# Patient Record
Sex: Female | Born: 1985 | Race: Black or African American | Hispanic: No | Marital: Single | Smoking: Never smoker
Health system: Southern US, Community
[De-identification: ages and names within clinical notes are randomized; demographics above are authoritative.]

## PROBLEM LIST (undated history)

## (undated) ENCOUNTER — Inpatient Hospital Stay (HOSPITAL_COMMUNITY): Payer: Self-pay

## (undated) DIAGNOSIS — F419 Anxiety disorder, unspecified: Secondary | ICD-10-CM

## (undated) DIAGNOSIS — E669 Obesity, unspecified: Secondary | ICD-10-CM

## (undated) DIAGNOSIS — D649 Anemia, unspecified: Secondary | ICD-10-CM

## (undated) DIAGNOSIS — R87629 Unspecified abnormal cytological findings in specimens from vagina: Secondary | ICD-10-CM

---
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
## 2004-01-23 ENCOUNTER — Ambulatory Visit: Payer: Self-pay | Admitting: Family Medicine

## 2004-02-20 ENCOUNTER — Emergency Department (HOSPITAL_COMMUNITY): Admission: EM | Admit: 2004-02-20 | Discharge: 2004-02-20 | Payer: Self-pay | Admitting: Emergency Medicine

## 2005-01-03 ENCOUNTER — Inpatient Hospital Stay (HOSPITAL_COMMUNITY): Admission: AD | Admit: 2005-01-03 | Discharge: 2005-01-03 | Payer: Self-pay | Admitting: Family Medicine

## 2005-10-13 ENCOUNTER — Inpatient Hospital Stay (HOSPITAL_COMMUNITY): Admission: AD | Admit: 2005-10-13 | Discharge: 2005-10-14 | Payer: Self-pay | Admitting: Obstetrics & Gynecology

## 2006-02-10 ENCOUNTER — Emergency Department (HOSPITAL_COMMUNITY): Admission: EM | Admit: 2006-02-10 | Discharge: 2006-02-10 | Payer: Self-pay | Admitting: Emergency Medicine

## 2006-02-23 ENCOUNTER — Inpatient Hospital Stay (HOSPITAL_COMMUNITY): Admission: AD | Admit: 2006-02-23 | Discharge: 2006-02-23 | Payer: Self-pay | Admitting: Gynecology

## 2006-07-05 ENCOUNTER — Emergency Department (HOSPITAL_COMMUNITY): Admission: EM | Admit: 2006-07-05 | Discharge: 2006-07-05 | Payer: Self-pay | Admitting: Emergency Medicine

## 2006-07-25 ENCOUNTER — Inpatient Hospital Stay (HOSPITAL_COMMUNITY): Admission: AD | Admit: 2006-07-25 | Discharge: 2006-07-25 | Payer: Self-pay | Admitting: Obstetrics & Gynecology

## 2006-10-13 ENCOUNTER — Emergency Department (HOSPITAL_COMMUNITY): Admission: EM | Admit: 2006-10-13 | Discharge: 2006-10-13 | Payer: Self-pay | Admitting: Emergency Medicine

## 2006-11-03 ENCOUNTER — Inpatient Hospital Stay (HOSPITAL_COMMUNITY): Admission: AD | Admit: 2006-11-03 | Discharge: 2006-11-04 | Payer: Self-pay | Admitting: Family Medicine

## 2007-01-04 ENCOUNTER — Emergency Department (HOSPITAL_COMMUNITY): Admission: EM | Admit: 2007-01-04 | Discharge: 2007-01-04 | Payer: Self-pay | Admitting: Family Medicine

## 2007-01-30 ENCOUNTER — Inpatient Hospital Stay (HOSPITAL_COMMUNITY): Admission: AD | Admit: 2007-01-30 | Discharge: 2007-01-30 | Payer: Self-pay | Admitting: Obstetrics and Gynecology

## 2007-03-17 ENCOUNTER — Inpatient Hospital Stay (HOSPITAL_COMMUNITY): Admission: AD | Admit: 2007-03-17 | Discharge: 2007-03-17 | Payer: Self-pay | Admitting: Obstetrics & Gynecology

## 2007-05-24 ENCOUNTER — Inpatient Hospital Stay (HOSPITAL_COMMUNITY): Admission: AD | Admit: 2007-05-24 | Discharge: 2007-05-24 | Payer: Self-pay | Admitting: Obstetrics and Gynecology

## 2007-06-02 ENCOUNTER — Inpatient Hospital Stay (HOSPITAL_COMMUNITY): Admission: AD | Admit: 2007-06-02 | Discharge: 2007-06-02 | Payer: Self-pay | Admitting: Obstetrics

## 2007-06-15 ENCOUNTER — Inpatient Hospital Stay (HOSPITAL_COMMUNITY): Admission: AD | Admit: 2007-06-15 | Discharge: 2007-06-15 | Payer: Self-pay | Admitting: Obstetrics and Gynecology

## 2007-06-25 ENCOUNTER — Inpatient Hospital Stay (HOSPITAL_COMMUNITY): Admission: AD | Admit: 2007-06-25 | Discharge: 2007-06-25 | Payer: Self-pay | Admitting: Obstetrics and Gynecology

## 2007-07-02 ENCOUNTER — Inpatient Hospital Stay (HOSPITAL_COMMUNITY): Admission: AD | Admit: 2007-07-02 | Discharge: 2007-07-05 | Payer: Self-pay | Admitting: Obstetrics and Gynecology

## 2007-10-21 ENCOUNTER — Emergency Department (HOSPITAL_COMMUNITY): Admission: EM | Admit: 2007-10-21 | Discharge: 2007-10-21 | Payer: Self-pay | Admitting: Emergency Medicine

## 2008-08-19 ENCOUNTER — Inpatient Hospital Stay (HOSPITAL_COMMUNITY): Admission: AD | Admit: 2008-08-19 | Discharge: 2008-08-19 | Payer: Self-pay | Admitting: Obstetrics and Gynecology

## 2008-10-02 ENCOUNTER — Emergency Department (HOSPITAL_COMMUNITY): Admission: EM | Admit: 2008-10-02 | Discharge: 2008-10-02 | Payer: Self-pay | Admitting: Family Medicine

## 2009-03-19 ENCOUNTER — Emergency Department (HOSPITAL_COMMUNITY): Admission: EM | Admit: 2009-03-19 | Discharge: 2009-03-19 | Payer: Self-pay | Admitting: Emergency Medicine

## 2009-04-10 ENCOUNTER — Emergency Department (HOSPITAL_COMMUNITY): Admission: EM | Admit: 2009-04-10 | Discharge: 2009-04-10 | Payer: Self-pay | Admitting: Emergency Medicine

## 2009-05-09 ENCOUNTER — Emergency Department (HOSPITAL_COMMUNITY): Admission: EM | Admit: 2009-05-09 | Discharge: 2009-05-09 | Payer: Self-pay | Admitting: Emergency Medicine

## 2009-11-02 ENCOUNTER — Ambulatory Visit: Payer: Self-pay | Admitting: Family

## 2009-11-02 ENCOUNTER — Inpatient Hospital Stay (HOSPITAL_COMMUNITY): Admission: AD | Admit: 2009-11-02 | Discharge: 2009-11-02 | Payer: Self-pay | Admitting: Obstetrics & Gynecology

## 2010-01-11 ENCOUNTER — Inpatient Hospital Stay (HOSPITAL_COMMUNITY): Admission: AD | Admit: 2010-01-11 | Discharge: 2010-01-11 | Payer: Self-pay | Admitting: Obstetrics and Gynecology

## 2010-01-11 ENCOUNTER — Ambulatory Visit: Payer: Self-pay | Admitting: Physician Assistant

## 2010-02-13 ENCOUNTER — Ambulatory Visit (HOSPITAL_COMMUNITY): Admission: RE | Admit: 2010-02-13 | Discharge: 2010-02-13 | Payer: Self-pay | Admitting: Obstetrics and Gynecology

## 2010-05-28 ENCOUNTER — Inpatient Hospital Stay (HOSPITAL_COMMUNITY)
Admission: AD | Admit: 2010-05-28 | Discharge: 2010-05-31 | DRG: 775 | Disposition: A | Discharge status: Home / Self Care | Payer: Medicaid Other | Source: Ambulatory Visit | Attending: Obstetrics and Gynecology | Admitting: Obstetrics and Gynecology

## 2010-05-28 DIAGNOSIS — E669 Obesity, unspecified: Secondary | ICD-10-CM | POA: Diagnosis present

## 2010-05-28 LAB — CBC
HCT: 31.2 % — ABNORMAL LOW (ref 36.0–46.0)
Hemoglobin: 10.2 g/dL — ABNORMAL LOW (ref 12.0–15.0)
MCH: 23 pg — ABNORMAL LOW (ref 26.0–34.0)
MCHC: 32.7 g/dL (ref 30.0–36.0)
MCV: 70.3 fL — ABNORMAL LOW (ref 78.0–100.0)
Platelets: 228 10*3/uL (ref 150–400)
RBC: 4.44 MIL/uL (ref 3.87–5.11)
RDW: 16 % — ABNORMAL HIGH (ref 11.5–15.5)
WBC: 13 10*3/uL — ABNORMAL HIGH (ref 4.0–10.5)

## 2010-05-29 LAB — RPR: RPR Ser Ql: NONREACTIVE

## 2010-05-30 LAB — CBC
HCT: 25.5 % — ABNORMAL LOW (ref 36.0–46.0)
Hemoglobin: 8 g/dL — ABNORMAL LOW (ref 12.0–15.0)
MCH: 22.3 pg — ABNORMAL LOW (ref 26.0–34.0)
MCHC: 31.4 g/dL (ref 30.0–36.0)
MCV: 71.2 fL — ABNORMAL LOW (ref 78.0–100.0)
Platelets: 199 10*3/uL (ref 150–400)
RBC: 3.58 MIL/uL — ABNORMAL LOW (ref 3.87–5.11)
RDW: 16.4 % — ABNORMAL HIGH (ref 11.5–15.5)
WBC: 10.4 10*3/uL (ref 4.0–10.5)

## 2010-05-30 NOTE — Discharge Summary (Signed)
  NAMEVAL, FARNAM NO.:  [id number]  MEDICAL RECORD NO.:  [id number]           PATIENT TYPE:  I  LOCATION:  9133                          FACILITY:  [facility]  PHYSICIAN:  Gerrit Friends. Aldona Bar, M.D.   DATE OF BIRTH:  11/29/1985  DATE OF ADMISSION:  05/28/2010 DATE OF DISCHARGE:  05/31/2010                              DISCHARGE SUMMARY   DISCHARGE DIAGNOSES: 1. 36 week pregnancy, delivered 6 pounds 7 ounces female infant, Apgars 8     and 9. 2. Blood type is O+. 3. Obesity.  PROCEDURE:  Normal spontaneous delivery.  SUMMARY:  This 25 year old gravida 64, now para 3 with a due date of June 26, 2010, presented at 36+ weeks gestation in labor.  At the time of admission, she was 4 cm dilated.  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  Her group B strep was negative.  She was admitted and several hours later progressed and delivered.  The baby weighed 6 pounds 7 ounces and had Apgars of 8 and 9 and did well. Mother was breast-feeding without difficulty.  On the morning of May 30, 2010, her hemoglobin was noted to be 8.0 with a white count of 10,400, and platelet count of 199,000.  On the morning of May 31, 2010, the patient was doing well and expressed a desire for discharge.  Assuming the baby will be discharged today as well, mother was discharged to home with followup to be arranged in the office.  She will also call the office to arrange the circumcision.  She was given a discharge brochure at the time of discharge and understood all instructions well.  MEDICATIONS AT THE TIME OF DISCHARGE:  Vitamins - one a day.  She will start on some iron similar to Feosol capsules with equivalent thereof one daily.  She was given a prescription for Motrin 600 mg to use every 6 hours as needed for cramping and pain.  Follow up in the office for the circumcision in approximately one  week's time and for postpartum visit in four weeks' time.  CONDITION ON DISCHARGE:  Improved.     Gerrit Friends. Aldona Bar, M.D.     RMW/MEDQ  D:  05/30/2010  T:  05/30/2010  Job:  [id number]  Electronically Signed by Annamaria Helling M.D. on 05/30/2010 03:40:58 PM

## 2010-06-18 LAB — URINALYSIS, ROUTINE W REFLEX MICROSCOPIC
Bilirubin Urine: NEGATIVE
Glucose, UA: NEGATIVE mg/dL
Hgb urine dipstick: NEGATIVE
Ketones, ur: NEGATIVE mg/dL
Nitrite: NEGATIVE
Protein, ur: NEGATIVE mg/dL
Specific Gravity, Urine: 1.015 (ref 1.005–1.030)
Urobilinogen, UA: 0.2 mg/dL (ref 0.0–1.0)
pH: 6 (ref 5.0–8.0)

## 2010-06-20 LAB — URINALYSIS, ROUTINE W REFLEX MICROSCOPIC
Bilirubin Urine: NEGATIVE
Glucose, UA: NEGATIVE mg/dL
Hgb urine dipstick: NEGATIVE
Ketones, ur: NEGATIVE mg/dL
Nitrite: NEGATIVE
Protein, ur: NEGATIVE mg/dL
Specific Gravity, Urine: >1.03 — ABNORMAL HIGH (ref 1.005–1.030)
Urobilinogen, UA: 0.2 mg/dL (ref 0.0–1.0)
pH: 6 (ref 5.0–8.0)

## 2010-06-20 LAB — POCT PREGNANCY, URINE: Preg Test, Ur: POSITIVE

## 2010-06-26 ENCOUNTER — Inpatient Hospital Stay (HOSPITAL_COMMUNITY): Admission: AD | Admit: 2010-06-26 | Payer: Self-pay | Source: Home / Self Care | Admitting: Obstetrics & Gynecology

## 2010-07-07 LAB — POCT URINALYSIS DIP (DEVICE)
Bilirubin Urine: NEGATIVE
Glucose, UA: NEGATIVE mg/dL
Hgb urine dipstick: NEGATIVE
Nitrite: NEGATIVE
Protein, ur: NEGATIVE mg/dL
Specific Gravity, Urine: 1.02 (ref 1.005–1.030)
Urobilinogen, UA: 1 mg/dL (ref 0.0–1.0)
pH: 6.5 (ref 5.0–8.0)

## 2010-07-07 LAB — URINE CULTURE: Colony Count: 75000

## 2010-07-07 LAB — POCT PREGNANCY, URINE: Preg Test, Ur: NEGATIVE

## 2010-07-07 LAB — PREGNANCY, URINE: Preg Test, Ur: NEGATIVE

## 2010-07-14 LAB — URINE MICROSCOPIC-ADD ON

## 2010-07-14 LAB — URINALYSIS, ROUTINE W REFLEX MICROSCOPIC
Bilirubin Urine: NEGATIVE
Glucose, UA: NEGATIVE mg/dL
Hgb urine dipstick: NEGATIVE
Ketones, ur: NEGATIVE mg/dL
Leukocytes, UA: NEGATIVE
Nitrite: POSITIVE — AB
Protein, ur: NEGATIVE mg/dL
Specific Gravity, Urine: 1.025 (ref 1.005–1.030)
Urobilinogen, UA: 1 mg/dL (ref 0.0–1.0)
pH: 7 (ref 5.0–8.0)

## 2010-12-28 LAB — CBC
HCT: 30.1 — ABNORMAL LOW
HCT: 31.6 — ABNORMAL LOW
Hemoglobin: 10.1 — ABNORMAL LOW
Hemoglobin: 10.8 — ABNORMAL LOW
MCHC: 33.6
MCHC: 34.1
MCV: 76.9 — ABNORMAL LOW
MCV: 77.8 — ABNORMAL LOW
Platelets: 235
Platelets: 242
RBC: 3.87
RBC: 4.11
RDW: 17.2 — ABNORMAL HIGH
RDW: 17.6 — ABNORMAL HIGH
WBC: 14.4 — ABNORMAL HIGH
WBC: 15 — ABNORMAL HIGH

## 2010-12-28 LAB — RPR: RPR Ser Ql: NONREACTIVE

## 2011-01-01 LAB — RAPID STREP SCREEN (MED CTR MEBANE ONLY): Streptococcus, Group A Screen (Direct): POSITIVE — AB

## 2011-01-13 LAB — URINALYSIS, ROUTINE W REFLEX MICROSCOPIC
Glucose, UA: NEGATIVE
Hgb urine dipstick: NEGATIVE
pH: 6

## 2011-01-14 LAB — WET PREP, GENITAL
Trich, Wet Prep: NONE SEEN
Yeast Wet Prep HPF POC: NONE SEEN

## 2011-01-14 LAB — GC/CHLAMYDIA PROBE AMP, GENITAL: GC Probe Amp, Genital: POSITIVE — AB

## 2011-01-18 LAB — URINALYSIS, ROUTINE W REFLEX MICROSCOPIC
Ketones, ur: 15 — AB
Nitrite: NEGATIVE
Specific Gravity, Urine: >1.03 — ABNORMAL HIGH
pH: 6

## 2011-01-18 LAB — CBC
Hemoglobin: 12.5
RBC: 4.59
WBC: 14.5 — ABNORMAL HIGH

## 2011-01-18 LAB — URINE CULTURE

## 2011-01-18 LAB — URINE MICROSCOPIC-ADD ON

## 2011-01-18 LAB — WET PREP, GENITAL

## 2011-01-18 LAB — ABO/RH: ABO/RH(D): O POS

## 2011-04-30 ENCOUNTER — Encounter (HOSPITAL_COMMUNITY): Payer: Self-pay

## 2011-04-30 ENCOUNTER — Telehealth (HOSPITAL_COMMUNITY): Payer: Self-pay | Admitting: *Deleted

## 2011-04-30 ENCOUNTER — Emergency Department (INDEPENDENT_AMBULATORY_CARE_PROVIDER_SITE_OTHER)
Admission: EM | Admit: 2011-04-30 | Discharge: 2011-04-30 | Disposition: A | Discharge status: Home / Self Care | Payer: Self-pay | Source: Home / Self Care

## 2011-04-30 DIAGNOSIS — N390 Urinary tract infection, site not specified: Secondary | ICD-10-CM

## 2011-04-30 LAB — POCT URINALYSIS DIP (DEVICE)
Bilirubin Urine: NEGATIVE
Nitrite: POSITIVE — AB
Protein, ur: >=300 mg/dL — AB
pH: 5.5 (ref 5.0–8.0)

## 2011-04-30 LAB — POCT PREGNANCY, URINE: Preg Test, Ur: NEGATIVE

## 2011-04-30 MED ORDER — CIPROFLOXACIN HCL 500 MG PO TABS: 500.0000 mg | ORAL_TABLET | Freq: Two times a day (BID) | ORAL | Status: AC

## 2011-04-30 MED ORDER — PHENAZOPYRIDINE HCL 200 MG PO TABS: 200.0000 mg | ORAL_TABLET | Freq: Three times a day (TID) | ORAL | Status: AC

## 2011-04-30 NOTE — ED Provider Notes (Signed)
History     CSN: [id number]  Arrival date & time 04/30/11  1145   None     Chief Complaint  Patient presents with  . Urinary Tract Infection    (Consider location/radiation/quality/duration/timing/severity/associated sxs/prior treatment) HPI Comments: Patient presents concerned that she has a urinary tract infection. She states she began 4 days ago with urinary urgency, frequency, and dysuria. She now also has low back discomfort. No fever, chills , nausea, or vomiting. She has not been taking anything for her symptoms.   History reviewed. No pertinent past medical history.  History reviewed. No pertinent past surgical history.  No family history on file.  History  Substance Use Topics  . Smoking status: Never Smoker   . Smokeless tobacco: Not on file  . Alcohol Use: Yes    OB History    Grav Para Term Preterm Abortions TAB SAB Ect Mult Living                  Review of Systems  Constitutional: Negative for fever and chills.  Gastrointestinal: Positive for abdominal pain (suprapubic). Negative for nausea, vomiting and diarrhea.  Genitourinary: Positive for dysuria, urgency and frequency.    Allergies  Review of patient's allergies indicates no known allergies.  Home Medications   Current Outpatient Rx  Name Route Sig Dispense Refill  . CIPROFLOXACIN HCL 500 MG PO TABS Oral Take 1 tablet (500 mg total) by mouth every 12 (twelve) hours. 10 tablet 0  . PHENAZOPYRIDINE HCL 200 MG PO TABS Oral Take 1 tablet (200 mg total) by mouth 3 (three) times daily. 6 tablet 0    BP 118/70  Pulse 98  Temp(Src) 99.1 F (37.3 C) (Oral)  Resp 20  SpO2 100%  LMP 03/18/2011  Physical Exam  Nursing note and vitals reviewed. Constitutional: She appears well-developed and well-nourished. No distress.  HENT:  Head: Normocephalic and atraumatic.  Cardiovascular: Normal rate, regular rhythm and normal heart sounds.   Pulmonary/Chest: Effort normal and breath sounds normal.    Abdominal: Normal appearance and bowel sounds are normal. She exhibits no mass. There is no hepatosplenomegaly. There is tenderness in the suprapubic area. There is no guarding and no CVA tenderness.  Neurological: She is alert.  Skin: Skin is warm and dry.  Psychiatric: She has a normal mood and affect.    ED Course  Procedures (including critical care time)  Labs Reviewed  POCT URINALYSIS DIP (DEVICE) - Abnormal; Notable for the following:    Hgb urine dipstick MODERATE (*)    Protein, ur >=300 (*)    Nitrite POSITIVE (*)    Leukocytes, UA LARGE (*) Biochemical Testing Only. Please order routine urinalysis from main lab if confirmatory testing is needed.   All other components within normal limits  POCT PREGNANCY, URINE  POCT URINALYSIS DIPSTICK  POCT PREGNANCY, URINE   No results found.   1. UTI (lower urinary tract infection)       MDM  UA pos. Urine preg neg.         Melody Comas, [place] 04/30/11 1340

## 2011-04-30 NOTE — ED Notes (Signed)
C/o urinary urgency, voiding small amounts, lower abdominal pressure and low back pain since 04/26/11.

## 2011-04-30 NOTE — ED Provider Notes (Signed)
Medical screening examination/treatment/procedure(s) were performed by non-physician practitioner and as supervising physician I was immediately available for consultation/collaboration.  Luiz Blare MD   Luiz Blare, MD 04/30/11 [phone]

## 2013-06-24 ENCOUNTER — Encounter (HOSPITAL_COMMUNITY): Payer: Self-pay | Admitting: Emergency Medicine

## 2013-06-24 ENCOUNTER — Emergency Department (HOSPITAL_COMMUNITY)
Admission: EM | Admit: 2013-06-24 | Discharge: 2013-06-24 | Disposition: A | Discharge status: Home / Self Care | Payer: Medicaid Other | Source: Home / Self Care | Attending: Emergency Medicine | Admitting: Emergency Medicine

## 2013-06-24 DIAGNOSIS — J020 Streptococcal pharyngitis: Secondary | ICD-10-CM

## 2013-06-24 LAB — POCT RAPID STREP A: STREPTOCOCCUS, GROUP A SCREEN (DIRECT): POSITIVE — AB

## 2013-06-24 MED ORDER — PENICILLIN G BENZATHINE 1200000 UNIT/2ML IM SUSP
1.2000 10*6.[IU] | Freq: Once | INTRAMUSCULAR | Status: AC
  Administered 2013-06-24: 1.2 10*6.[IU] via INTRAMUSCULAR

## 2013-06-24 MED ORDER — KETOROLAC TROMETHAMINE 60 MG/2ML IM SOLN
60.0000 mg | Freq: Once | INTRAMUSCULAR | Status: AC
  Administered 2013-06-24: 60 mg via INTRAMUSCULAR

## 2013-06-24 MED ORDER — KETOROLAC TROMETHAMINE 60 MG/2ML IM SOLN
INTRAMUSCULAR | Status: AC
  Filled 2013-06-24: qty 2

## 2013-06-24 MED ORDER — AMOXICILLIN 875 MG PO TABS: 875.0000 mg | ORAL_TABLET | Freq: Two times a day (BID) | ORAL | Status: DC

## 2013-06-24 MED ORDER — PENICILLIN G BENZATHINE 1200000 UNIT/2ML IM SUSP
INTRAMUSCULAR | Status: AC
  Filled 2013-06-24: qty 2

## 2013-06-24 MED ORDER — METHYLPREDNISOLONE 4 MG PO KIT: PACK | ORAL | Status: DC

## 2013-06-24 MED ORDER — IBUPROFEN 800 MG PO TABS: 800.0000 mg | ORAL_TABLET | Freq: Three times a day (TID) | ORAL | Status: DC

## 2013-06-24 MED ORDER — DEXAMETHASONE SODIUM PHOSPHATE 10 MG/ML IJ SOLN
INTRAMUSCULAR | Status: AC
  Filled 2013-06-24: qty 1

## 2013-06-24 MED ORDER — DEXAMETHASONE SODIUM PHOSPHATE 10 MG/ML IJ SOLN
10.0000 mg | Freq: Once | INTRAMUSCULAR | Status: AC
  Administered 2013-06-24: 10 mg via INTRAMUSCULAR

## 2013-06-24 NOTE — ED Provider Notes (Signed)
Medical screening examination/treatment/procedure(s) were performed by non-physician practitioner and as supervising physician I was immediately available for consultation/collaboration.  Leslee Homeavid Yessica Putnam, M.D.   Reuben Likesavid C Sheresa Cullop, MD 06/24/13 [phone]

## 2013-06-24 NOTE — ED Notes (Signed)
C/o sore throat onset Friday.  Chills and fever onset yesterday.

## 2013-06-24 NOTE — Discharge Instructions (Signed)
Strep Throat  Strep throat is an infection of the throat caused by a bacteria named Streptococcus pyogenes. Your caregiver may call the infection streptococcal "tonsillitis" or "pharyngitis" depending on whether there are signs of inflammation in the tonsils or back of the throat. Strep throat is most common in children aged 29 15 years during the cold months of the year, but it can occur in people of any age during any season. This infection is spread from person to person (contagious) through coughing, sneezing, or other close contact.  SYMPTOMS   · Fever or chills.  · Painful, swollen, red tonsils or throat.  · Pain or difficulty when swallowing.  · White or yellow spots on the tonsils or throat.  · Swollen, tender lymph nodes or "glands" of the neck or under the jaw.  · Red rash all over the body (rare).  DIAGNOSIS   Many different infections can cause the same symptoms. A test must be done to confirm the diagnosis so the right treatment can be given. A "rapid strep test" can help your caregiver make the diagnosis in a few minutes. If this test is not available, a light swab of the infected area can be used for a throat culture test. If a throat culture test is done, results are usually available in a day or two.  TREATMENT   Strep throat is treated with antibiotic medicine.  HOME CARE INSTRUCTIONS   · Gargle with 1 tsp of salt in 1 cup of warm water, 3 4 times per day or as needed for comfort.  · Family members who also have a sore throat or fever should be tested for strep throat and treated with antibiotics if they have the strep infection.  · Make sure everyone in your household washes their hands well.  · Do not share food, drinking cups, or personal items that could cause the infection to spread to others.  · You may need to eat a soft food diet until your sore throat gets better.  · Drink enough water and fluids to keep your urine clear or pale yellow. This will help prevent dehydration.  · Get plenty of  rest.  · Stay home from school, daycare, or work until you have been on antibiotics for 24 hours.  · Only take over-the-counter or prescription medicines for pain, discomfort, or fever as directed by your caregiver.  · If antibiotics are prescribed, take them as directed. Finish them even if you start to feel better.  SEEK MEDICAL CARE IF:   · The glands in your neck continue to enlarge.  · You develop a rash, cough, or earache.  · You cough up green, yellow-brown, or bloody sputum.  · You have pain or discomfort not controlled by medicines.  · Your problems seem to be getting worse rather than better.  SEEK IMMEDIATE MEDICAL CARE IF:   · You develop any new symptoms such as vomiting, severe headache, stiff or painful neck, chest pain, shortness of breath, or trouble swallowing.  · You develop severe throat pain, drooling, or changes in your voice.  · You develop swelling of the neck, or the skin on the neck becomes red and tender.  · You have a fever.  · You develop signs of dehydration, such as fatigue, dry mouth, and decreased urination.  · You become increasingly sleepy, or you cannot wake up completely.  Document Released: 03/19/2000 Document Revised: 03/08/2012 Document Reviewed: 05/21/2010  [employer]® Patient Information ©2014 [employer].

## 2013-06-24 NOTE — ED Provider Notes (Signed)
CSN: [id number]     Arrival date & time 06/24/13  1306 History   First MD Initiated Contact with Patient 06/24/13 1502     No chief complaint on file.  (Consider location/radiation/quality/duration/timing/severity/associated sxs/prior Treatment) HPI Comments: 28 year old female presents complaining of fever, chills, very sore throat, getting worse for 3 days. She has been using various over-the-counter medications without relief. Her last temperature was 102 at home. No cough, NVD, chest pain, shortness of breath. No sick contacts.   No past medical history on file. No past surgical history on file. No family history on file. History  Substance Use Topics  . Smoking status: Never Smoker   . Smokeless tobacco: Not on file  . Alcohol Use: Yes   OB History   Grav Para Term Preterm Abortions TAB SAB Ect Mult Living                 Review of Systems  Constitutional: Positive for fever, chills and fatigue.  HENT: Positive for congestion and sore throat. Negative for ear pain.   Eyes: Negative for visual disturbance.  Respiratory: Negative for cough and shortness of breath.   Cardiovascular: Negative for chest pain, palpitations and leg swelling.  Gastrointestinal: Negative for nausea, vomiting and abdominal pain.  Endocrine: Negative for polydipsia and polyuria.  Genitourinary: Negative for dysuria, urgency and frequency.  Musculoskeletal: Negative for arthralgias and myalgias.  Skin: Negative for rash.  Neurological: Negative for dizziness, weakness and light-headedness.    Allergies  Review of patient's allergies indicates no known allergies.  Home Medications   Current Outpatient Rx  Name  Route  Sig  Dispense  Refill  . amoxicillin (AMOXIL) 875 MG tablet   Oral   Take 1 tablet (875 mg total) by mouth 2 (two) times daily.   14 tablet   0   . ibuprofen (ADVIL,MOTRIN) 800 MG tablet   Oral   Take 1 tablet (800 mg total) by mouth 3 (three) times daily.   30 tablet   0   . methylPREDNISolone (MEDROL DOSEPAK) 4 MG tablet      Use as directed on package instructions   21 tablet   0    BP 137/79  Pulse 104  Temp(Src) 99.8 F (37.7 C) (Oral)  Resp 16  SpO2 97% Physical Exam  Nursing note and vitals reviewed. Constitutional: She is oriented to person, place, and time. Vital signs are normal. She appears well-developed and well-nourished. No distress.  HENT:  Head: Normocephalic and atraumatic.  Mouth/Throat: Uvula is midline. Oropharyngeal exudate and posterior oropharyngeal erythema present.  Neck: Normal range of motion. Neck supple.  Cardiovascular: Regular rhythm and normal heart sounds.  Tachycardia present.  Exam reveals no gallop and no friction rub.   No murmur heard. Pulmonary/Chest: Effort normal and breath sounds normal. No respiratory distress. She has no wheezes. She has no rales.  Lymphadenopathy:       Head (right side): Tonsillar adenopathy present.       Head (left side): Tonsillar adenopathy present.    She has cervical adenopathy.       Right cervical: Posterior cervical adenopathy present.       Left cervical: Posterior cervical adenopathy present.  Neurological: She is alert and oriented to person, place, and time. She has normal strength. Coordination normal.  Skin: Skin is warm and dry. No rash noted. She is not diaphoretic.  Psychiatric: She has a normal mood and affect. Judgment normal.    ED Course  Procedures (including critical  care time) Labs Review Labs Reviewed  POCT RAPID STREP A ([facility] CARE ONLY) - Abnormal; Notable for the following:    Streptococcus, Group A Screen (Direct) POSITIVE (*)    All other components within normal limits   Imaging Review No results found.   MDM   1. Strep pharyngitis    Rapid strep positive. She is requesting a penicillin shot here, will do that and then also treat with amoxicillin for a week. Also treating symptomatically. She declines any pain medicines. Followup when  necessary  Meds ordered this encounter  Medications  . penicillin g benzathine (BICILLIN LA) 1200000 UNIT/2ML injection 1.2 Million Units    Sig:     Order Specific Question:  Antibiotic Indication:    Answer:  Pharyngitis  . dexamethasone (DECADRON) injection 10 mg    Sig:   . ketorolac (TORADOL) injection 60 mg    Sig:   . amoxicillin (AMOXIL) 875 MG tablet    Sig: Take 1 tablet (875 mg total) by mouth 2 (two) times daily.    Dispense:  14 tablet    Refill:  0    Order Specific Question:  Supervising Provider    Answer:  Linna Hoff [phone]  . methylPREDNISolone (MEDROL DOSEPAK) 4 MG tablet    Sig: Use as directed on package instructions    Dispense:  21 tablet    Refill:  0    Order Specific Question:  Supervising Provider    Answer:  Linna Hoff [phone]  . ibuprofen (ADVIL,MOTRIN) 800 MG tablet    Sig: Take 1 tablet (800 mg total) by mouth 3 (three) times daily.    Dispense:  30 tablet    Refill:  0    Order Specific Question:  Supervising Provider    Answer:  Bradd Canary D [[id number]]       Graylon Good, PA-C 06/24/13 1510

## 2013-11-23 ENCOUNTER — Emergency Department (HOSPITAL_COMMUNITY)
Admission: EM | Admit: 2013-11-23 | Discharge: 2013-11-23 | Disposition: A | Discharge status: Home / Self Care | Payer: Medicaid Other | Attending: Emergency Medicine | Admitting: Emergency Medicine

## 2013-11-23 ENCOUNTER — Encounter (HOSPITAL_COMMUNITY): Payer: Self-pay | Admitting: Emergency Medicine

## 2013-11-23 DIAGNOSIS — R112 Nausea with vomiting, unspecified: Secondary | ICD-10-CM | POA: Diagnosis not present

## 2013-11-23 DIAGNOSIS — R1031 Right lower quadrant pain: Secondary | ICD-10-CM | POA: Diagnosis not present

## 2013-11-23 LAB — CBC WITH DIFFERENTIAL/PLATELET
Basophils Absolute: 0 10*3/uL (ref 0.0–0.1)
Basophils Relative: 0 % (ref 0–1)
Eosinophils Absolute: 0 10*3/uL (ref 0.0–0.7)
Eosinophils Relative: 0 % (ref 0–5)
HEMATOCRIT: 35.5 % — AB (ref 36.0–46.0)
HEMOGLOBIN: 12.3 g/dL (ref 12.0–15.0)
LYMPHS ABS: 2.5 10*3/uL (ref 0.7–4.0)
LYMPHS PCT: 18 % (ref 12–46)
MCH: 25.7 pg — ABNORMAL LOW (ref 26.0–34.0)
MCHC: 34.6 g/dL (ref 30.0–36.0)
MCV: 74.3 fL — ABNORMAL LOW (ref 78.0–100.0)
MONO ABS: 0.6 10*3/uL (ref 0.1–1.0)
MONOS PCT: 5 % (ref 3–12)
NEUTROS ABS: 11 10*3/uL — AB (ref 1.7–7.7)
NEUTROS PCT: 77 % (ref 43–77)
Platelets: 392 10*3/uL (ref 150–400)
RBC: 4.78 MIL/uL (ref 3.87–5.11)
RDW: 14.3 % (ref 11.5–15.5)
WBC: 13.5 10*3/uL — ABNORMAL HIGH (ref 4.0–10.5)

## 2013-11-23 LAB — URINALYSIS, ROUTINE W REFLEX MICROSCOPIC
BILIRUBIN URINE: NEGATIVE
Glucose, UA: NEGATIVE mg/dL
HGB URINE DIPSTICK: NEGATIVE
Ketones, ur: 15 mg/dL — AB
Leukocytes, UA: NEGATIVE
Nitrite: NEGATIVE
PH: 5 (ref 5.0–8.0)
Protein, ur: NEGATIVE mg/dL
SPECIFIC GRAVITY, URINE: 1.019 (ref 1.005–1.030)
Urobilinogen, UA: 0.2 mg/dL (ref 0.0–1.0)

## 2013-11-23 LAB — COMPREHENSIVE METABOLIC PANEL
ALBUMIN: 3.9 g/dL (ref 3.5–5.2)
ALK PHOS: 112 U/L (ref 39–117)
ALT: 46 U/L — ABNORMAL HIGH (ref 0–35)
AST: 44 U/L — AB (ref 0–37)
Anion gap: 19 — ABNORMAL HIGH (ref 5–15)
BILIRUBIN TOTAL: 0.2 mg/dL — AB (ref 0.3–1.2)
BUN: 5 mg/dL — AB (ref 6–23)
CO2: 19 mEq/L (ref 19–32)
Calcium: 9.8 mg/dL (ref 8.4–10.5)
Chloride: 105 mEq/L (ref 96–112)
Creatinine, Ser: 0.7 mg/dL (ref 0.50–1.10)
GFR calc Af Amer: >90 mL/min (ref >90)
GFR calc non Af Amer: >90 mL/min (ref >90)
Glucose, Bld: 107 mg/dL — ABNORMAL HIGH (ref 70–99)
POTASSIUM: 3.9 meq/L (ref 3.7–5.3)
Sodium: 143 mEq/L (ref 137–147)
Total Protein: 8.5 g/dL — ABNORMAL HIGH (ref 6.0–8.3)

## 2013-11-23 LAB — POC URINE PREG, ED: Preg Test, Ur: NEGATIVE

## 2013-11-23 LAB — WET PREP, GENITAL
Clue Cells Wet Prep HPF POC: NONE SEEN
TRICH WET PREP: NONE SEEN
WBC, Wet Prep HPF POC: NONE SEEN
Yeast Wet Prep HPF POC: NONE SEEN

## 2013-11-23 LAB — LIPASE, BLOOD: Lipase: 21 U/L (ref 11–59)

## 2013-11-23 MED ORDER — SODIUM CHLORIDE 0.9 % IV BOLUS (SEPSIS)
1000.0000 mL | Freq: Once | INTRAVENOUS | Status: AC
  Administered 2013-11-23: 1000 mL via INTRAVENOUS

## 2013-11-23 MED ORDER — ONDANSETRON 4 MG PO TBDP: 8.0000 mg | ORAL_TABLET | Freq: Once | ORAL | Status: DC

## 2013-11-23 MED ORDER — ONDANSETRON HCL 4 MG PO TABS: 4.0000 mg | ORAL_TABLET | Freq: Four times a day (QID) | ORAL | Status: DC | PRN

## 2013-11-23 NOTE — ED Notes (Signed)
Pt. Reports having nausea but refuses Zofran .  Pt. Is in NAD GCS 15.  Informed pt. To come to the desk if anything changes.     Vitals WNL

## 2013-11-23 NOTE — Discharge Instructions (Signed)

## 2013-11-23 NOTE — ED Notes (Signed)
ED Resident at bedside.
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
## 2013-11-23 NOTE — ED Notes (Signed)
Pt c/o N/V after drinking ETOH last night

## 2013-11-23 NOTE — ED Notes (Signed)
PT comfortable with discharge and follow up instructions. Prescriptions x1. 

## 2013-11-24 LAB — GC/CHLAMYDIA PROBE AMP
CT PROBE, AMP APTIMA: NEGATIVE
GC PROBE AMP APTIMA: NEGATIVE
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
## 2014-03-15 LAB — OB RESULTS CONSOLE RPR: RPR: NONREACTIVE

## 2014-03-15 LAB — OB RESULTS CONSOLE RUBELLA ANTIBODY, IGM: RUBELLA: IMMUNE

## 2014-03-15 LAB — OB RESULTS CONSOLE ABO/RH: RH Type: POSITIVE

## 2014-03-15 LAB — OB RESULTS CONSOLE HEPATITIS B SURFACE ANTIGEN: Hepatitis B Surface Ag: NEGATIVE

## 2014-03-15 LAB — OB RESULTS CONSOLE ANTIBODY SCREEN: Antibody Screen: NEGATIVE

## 2014-03-15 LAB — OB RESULTS CONSOLE HIV ANTIBODY (ROUTINE TESTING): HIV: NONREACTIVE

## 2014-03-22 LAB — OB RESULTS CONSOLE GC/CHLAMYDIA
CHLAMYDIA, DNA PROBE: NEGATIVE
GC PROBE AMP, GENITAL: NEGATIVE

## 2014-04-05 NOTE — L&D Delivery Note (Signed)
SVD of VFI at 0208 on 09/21/14.  EBL 250cc.  Placenta to L&D.  APGARs 9,9. Head delivered LOA and body followed atraumatically.  Cord was clamped, cut and baby to abdomen.  Cord blood was obtained.  Placenta delivered S/I/3VC.  Fundus was firmed with pitocin and massage.  Vaginal lac was repaired with figure of eight stitch with 3-0 Rapide.  Mom and baby stable.  Mitchel Honour, DO

## 2014-08-04 ENCOUNTER — Inpatient Hospital Stay (HOSPITAL_COMMUNITY)
Admission: AD | Admit: 2014-08-04 | Discharge: 2014-08-05 | Disposition: A | Discharge status: Home / Self Care | Payer: Medicaid Other | Source: Ambulatory Visit | Attending: Obstetrics and Gynecology | Admitting: Obstetrics and Gynecology

## 2014-08-04 ENCOUNTER — Encounter (HOSPITAL_COMMUNITY): Payer: Self-pay | Admitting: *Deleted

## 2014-08-04 DIAGNOSIS — O368390 Maternal care for abnormalities of the fetal heart rate or rhythm, unspecified trimester, not applicable or unspecified: Secondary | ICD-10-CM | POA: Insufficient documentation

## 2014-08-04 DIAGNOSIS — O9989 Other specified diseases and conditions complicating pregnancy, childbirth and the puerperium: Secondary | ICD-10-CM | POA: Insufficient documentation

## 2014-08-04 DIAGNOSIS — O26893 Other specified pregnancy related conditions, third trimester: Secondary | ICD-10-CM

## 2014-08-04 DIAGNOSIS — O288 Other abnormal findings on antenatal screening of mother: Secondary | ICD-10-CM | POA: Insufficient documentation

## 2014-08-04 DIAGNOSIS — N898 Other specified noninflammatory disorders of vagina: Secondary | ICD-10-CM | POA: Insufficient documentation

## 2014-08-04 DIAGNOSIS — Z3A29 29 weeks gestation of pregnancy: Secondary | ICD-10-CM | POA: Insufficient documentation

## 2014-08-04 HISTORY — DX: Unspecified abnormal cytological findings in specimens from vagina: R87.629

## 2014-08-04 HISTORY — DX: Anxiety disorder, unspecified: F41.9
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
## 2014-08-04 NOTE — MAU Note (Signed)
Pt states yesterday am and today she has noticed a mucous discharge. Denies pain at this time.

## 2014-08-05 ENCOUNTER — Inpatient Hospital Stay (HOSPITAL_COMMUNITY): Payer: Medicaid Other

## 2014-08-05 DIAGNOSIS — O26893 Other specified pregnancy related conditions, third trimester: Secondary | ICD-10-CM

## 2014-08-05 DIAGNOSIS — O9989 Other specified diseases and conditions complicating pregnancy, childbirth and the puerperium: Secondary | ICD-10-CM | POA: Diagnosis not present

## 2014-08-05 DIAGNOSIS — Z3A30 30 weeks gestation of pregnancy: Secondary | ICD-10-CM

## 2014-08-05 DIAGNOSIS — N898 Other specified noninflammatory disorders of vagina: Secondary | ICD-10-CM

## 2014-08-05 DIAGNOSIS — Z3A29 29 weeks gestation of pregnancy: Secondary | ICD-10-CM | POA: Diagnosis not present

## 2014-08-06 DIAGNOSIS — Z3A29 29 weeks gestation of pregnancy: Secondary | ICD-10-CM | POA: Insufficient documentation

## 2014-08-06 DIAGNOSIS — O288 Other abnormal findings on antenatal screening of mother: Secondary | ICD-10-CM | POA: Insufficient documentation

## 2014-08-06 DIAGNOSIS — O368390 Maternal care for abnormalities of the fetal heart rate or rhythm, unspecified trimester, not applicable or unspecified: Secondary | ICD-10-CM | POA: Insufficient documentation

## 2014-09-21 ENCOUNTER — Inpatient Hospital Stay (HOSPITAL_COMMUNITY): Payer: Medicaid Other | Admitting: Anesthesiology

## 2014-09-21 ENCOUNTER — Encounter (HOSPITAL_COMMUNITY): Payer: Self-pay | Admitting: *Deleted

## 2014-09-21 ENCOUNTER — Inpatient Hospital Stay (HOSPITAL_COMMUNITY)
Admission: AD | Admit: 2014-09-21 | Discharge: 2014-09-22 | DRG: 775 | Disposition: A | Discharge status: Home / Self Care | Payer: Medicaid Other | Source: Ambulatory Visit | Attending: Obstetrics & Gynecology | Admitting: Obstetrics & Gynecology

## 2014-09-21 DIAGNOSIS — Z3A36 36 weeks gestation of pregnancy: Secondary | ICD-10-CM | POA: Diagnosis present

## 2014-09-21 DIAGNOSIS — Z3490 Encounter for supervision of normal pregnancy, unspecified, unspecified trimester: Secondary | ICD-10-CM

## 2014-09-21 DIAGNOSIS — O99214 Obesity complicating childbirth: Secondary | ICD-10-CM | POA: Diagnosis present

## 2014-09-21 DIAGNOSIS — Z833 Family history of diabetes mellitus: Secondary | ICD-10-CM | POA: Diagnosis not present

## 2014-09-21 DIAGNOSIS — Z6841 Body Mass Index (BMI) 40.0 and over, adult: Secondary | ICD-10-CM

## 2014-09-21 DIAGNOSIS — IMO0001 Reserved for inherently not codable concepts without codable children: Secondary | ICD-10-CM

## 2014-09-21 LAB — TYPE AND SCREEN
ABO/RH(D): O POS
Antibody Screen: NEGATIVE

## 2014-09-21 LAB — CBC
HEMATOCRIT: 32.3 % — AB (ref 36.0–46.0)
HEMOGLOBIN: 11 g/dL — AB (ref 12.0–15.0)
MCH: 25 pg — ABNORMAL LOW (ref 26.0–34.0)
MCHC: 34.1 g/dL (ref 30.0–36.0)
MCV: 73.4 fL — AB (ref 78.0–100.0)
PLATELETS: 244 10*3/uL (ref 150–400)
RBC: 4.4 MIL/uL (ref 3.87–5.11)
RDW: 16.6 % — AB (ref 11.5–15.5)
WBC: 14.7 10*3/uL — AB (ref 4.0–10.5)

## 2014-09-21 LAB — RPR: RPR: NONREACTIVE

## 2014-09-21 MED ORDER — PRENATAL MULTIVITAMIN CH
1.0000 | ORAL_TABLET | Freq: Every day | ORAL | Status: DC
  Administered 2014-09-21 – 2014-09-22 (×2): 1 via ORAL
  Filled 2014-09-21 (×2): qty 1

## 2014-09-21 MED ORDER — DIBUCAINE 1 % RE OINT: 1.0000 "application " | TOPICAL_OINTMENT | RECTAL | Status: DC | PRN

## 2014-09-21 MED ORDER — BUTORPHANOL TARTRATE 1 MG/ML IJ SOLN: 1.0000 mg | INTRAMUSCULAR | Status: DC | PRN

## 2014-09-21 MED ORDER — SIMETHICONE 80 MG PO CHEW: 80.0000 mg | CHEWABLE_TABLET | ORAL | Status: DC | PRN

## 2014-09-21 MED ORDER — BENZOCAINE-MENTHOL 20-0.5 % EX AERO
1.0000 "application " | INHALATION_SPRAY | CUTANEOUS | Status: DC | PRN
  Filled 2014-09-21: qty 56

## 2014-09-21 MED ORDER — ACETAMINOPHEN 325 MG PO TABS: 650.0000 mg | ORAL_TABLET | ORAL | Status: DC | PRN

## 2014-09-21 MED ORDER — FLEET ENEMA 7-19 GM/118ML RE ENEM: 1.0000 | ENEMA | RECTAL | Status: DC | PRN

## 2014-09-21 MED ORDER — LIDOCAINE HCL (PF) 1 % IJ SOLN
30.0000 mL | INTRAMUSCULAR | Status: DC | PRN
  Filled 2014-09-21: qty 30

## 2014-09-21 MED ORDER — WITCH HAZEL-GLYCERIN EX PADS: 1.0000 "application " | MEDICATED_PAD | CUTANEOUS | Status: DC | PRN

## 2014-09-21 MED ORDER — ONDANSETRON HCL 4 MG/2ML IJ SOLN: 4.0000 mg | INTRAMUSCULAR | Status: DC | PRN

## 2014-09-21 MED ORDER — OXYCODONE-ACETAMINOPHEN 5-325 MG PO TABS: 1.0000 | ORAL_TABLET | ORAL | Status: DC | PRN

## 2014-09-21 MED ORDER — LACTATED RINGERS IV SOLN
500.0000 mL | INTRAVENOUS | Status: DC | PRN
  Administered 2014-09-21: 500 mL via INTRAVENOUS

## 2014-09-21 MED ORDER — OXYTOCIN 40 UNITS IN LACTATED RINGERS INFUSION - SIMPLE MED
62.5000 mL/h | INTRAVENOUS | Status: DC
  Filled 2014-09-21: qty 1000

## 2014-09-21 MED ORDER — LANOLIN HYDROUS EX OINT: TOPICAL_OINTMENT | CUTANEOUS | Status: DC | PRN

## 2014-09-21 MED ORDER — DIPHENHYDRAMINE HCL 25 MG PO CAPS: 25.0000 mg | ORAL_CAPSULE | Freq: Four times a day (QID) | ORAL | Status: DC | PRN

## 2014-09-21 MED ORDER — ONDANSETRON HCL 4 MG/2ML IJ SOLN: 4.0000 mg | Freq: Once | INTRAMUSCULAR | Status: AC | PRN

## 2014-09-21 MED ORDER — ZOLPIDEM TARTRATE 5 MG PO TABS: 5.0000 mg | ORAL_TABLET | Freq: Every evening | ORAL | Status: DC | PRN

## 2014-09-21 MED ORDER — ONDANSETRON HCL 4 MG PO TABS: 4.0000 mg | ORAL_TABLET | ORAL | Status: DC | PRN

## 2014-09-21 MED ORDER — SENNOSIDES-DOCUSATE SODIUM 8.6-50 MG PO TABS
2.0000 | ORAL_TABLET | ORAL | Status: DC
  Administered 2014-09-21: 2 via ORAL
  Filled 2014-09-21: qty 2

## 2014-09-21 MED ORDER — FENTANYL CITRATE (PF) 100 MCG/2ML IJ SOLN: 25.0000 ug | INTRAMUSCULAR | Status: DC | PRN

## 2014-09-21 MED ORDER — PHENYLEPHRINE 40 MCG/ML (10ML) SYRINGE FOR IV PUSH (FOR BLOOD PRESSURE SUPPORT)
80.0000 ug | PREFILLED_SYRINGE | INTRAVENOUS | Status: DC | PRN
  Filled 2014-09-21: qty 2
  Filled 2014-09-21: qty 20

## 2014-09-21 MED ORDER — CITRIC ACID-SODIUM CITRATE 334-500 MG/5ML PO SOLN: 30.0000 mL | ORAL | Status: DC | PRN

## 2014-09-21 MED ORDER — OXYCODONE-ACETAMINOPHEN 5-325 MG PO TABS: 2.0000 | ORAL_TABLET | ORAL | Status: DC | PRN

## 2014-09-21 MED ORDER — LIDOCAINE HCL (PF) 1 % IJ SOLN
INTRAMUSCULAR | Status: DC | PRN
  Administered 2014-09-21 (×2): 4 mL

## 2014-09-21 MED ORDER — SODIUM CHLORIDE 0.9 % IV SOLN
2.0000 g | Freq: Once | INTRAVENOUS | Status: DC
  Administered 2014-09-21: 2 g via INTRAVENOUS
  Filled 2014-09-21: qty 2000

## 2014-09-21 MED ORDER — FENTANYL 2.5 MCG/ML BUPIVACAINE 1/10 % EPIDURAL INFUSION (WH - ANES)
14.0000 mL/h | INTRAMUSCULAR | Status: DC | PRN
  Administered 2014-09-21: 12.5 mL/h via EPIDURAL
  Administered 2014-09-21: 14 mL/h via EPIDURAL
  Filled 2014-09-21: qty 125

## 2014-09-21 MED ORDER — LACTATED RINGERS IV SOLN
INTRAVENOUS | Status: DC
  Administered 2014-09-21: 02:00:00 via INTRAVENOUS

## 2014-09-21 MED ORDER — ONDANSETRON HCL 4 MG/2ML IJ SOLN: 4.0000 mg | Freq: Four times a day (QID) | INTRAMUSCULAR | Status: DC | PRN

## 2014-09-21 MED ORDER — IBUPROFEN 600 MG PO TABS
600.0000 mg | ORAL_TABLET | Freq: Four times a day (QID) | ORAL | Status: DC
  Administered 2014-09-21 – 2014-09-22 (×7): 600 mg via ORAL
  Filled 2014-09-21 (×7): qty 1

## 2014-09-21 MED ORDER — DIPHENHYDRAMINE HCL 50 MG/ML IJ SOLN: 12.5000 mg | INTRAMUSCULAR | Status: DC | PRN

## 2014-09-21 MED ORDER — EPHEDRINE 5 MG/ML INJ
10.0000 mg | INTRAVENOUS | Status: DC | PRN
  Filled 2014-09-21: qty 2

## 2014-09-21 MED ORDER — TETANUS-DIPHTH-ACELL PERTUSSIS 5-2.5-18.5 LF-MCG/0.5 IM SUSP
0.5000 mL | Freq: Once | INTRAMUSCULAR | Status: AC
  Administered 2014-09-22: 0.5 mL via INTRAMUSCULAR
  Filled 2014-09-21: qty 0.5

## 2014-09-21 MED ORDER — OXYTOCIN BOLUS FROM INFUSION
500.0000 mL | INTRAVENOUS | Status: DC
  Administered 2014-09-21: 500 mL via INTRAVENOUS
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
## 2014-09-21 NOTE — MAU Note (Signed)
Report to Annabelle Harman, [facility] charge RN. Pt to be admitted to room 163.

## 2014-09-21 NOTE — Anesthesia Procedure Notes (Signed)
Epidural Patient location during procedure: OB  Staffing Anesthesiologist: Jovian Lembcke Performed by: anesthesiologist   Preanesthetic Checklist Completed: patient identified, site marked, surgical consent, pre-op evaluation, timeout performed, IV checked, risks and benefits discussed and monitors and equipment checked  Epidural Patient position: sitting Prep: site prepped and draped and DuraPrep Patient monitoring: continuous pulse ox and blood pressure Approach: midline Location: L3-L4 Injection technique: LOR saline  Needle:  Needle type: Tuohy  Needle gauge: 17 G Needle length: 9 cm and 9 Needle insertion depth: 8 cm Catheter type: closed end flexible Catheter size: 19 Gauge Catheter at skin depth: 14 cm Test dose: negative  Assessment Events: blood not aspirated, injection not painful, no injection resistance, negative IV test and no paresthesia  Additional Notes Patient identified. Risks/Benefits/Options discussed with patient including but not limited to bleeding, infection, nerve damage, paralysis, failed block, incomplete pain control, headache, blood pressure changes, nausea, vomiting, reactions to medication both or allergic, itching and postpartum back pain. Confirmed with bedside nurse the patient's most recent platelet count. Confirmed with patient that they are not currently taking any anticoagulation, have any bleeding history or any family history of bleeding disorders. Patient expressed understanding and wished to proceed. All questions were answered. Sterile technique was used throughout the entire procedure. Please see nursing notes for vital signs. Test dose was given through epidural catheter and negative prior to continuing to dose epidural or start infusion. Warning signs of high block given to the patient including shortness of breath, tingling/numbness in hands, complete motor block, or any concerning symptoms with instructions to call for help. Patient was  given instructions on fall risk and not to get out of bed. All questions and concerns addressed with instructions to call with any issues or inadequate analgesia.      

## 2014-09-21 NOTE — Anesthesia Postprocedure Evaluation (Signed)
Anesthesia Post Note  Patient: Amy Delgado  Procedure(s) Performed: * No procedures listed *  Anesthesia type: Epidural  Patient location: Mother/Baby  Post pain: Pain level controlled  Post assessment: Post-op Vital signs reviewed  Last Vitals:  Filed Vitals:   09/21/14 0545  BP: 113/53  Pulse: 96  Temp: 37.1 C  Resp: 16    Post vital signs: Reviewed  Level of consciousness: awake  Complications: No apparent anesthesia complications

## 2014-09-21 NOTE — MAU Note (Signed)
Patient has been contracting all day, no leaking of fluid, no vaginal bleeding. +fetal movement.  Pt states 4th baby all prior vag deliveries

## 2014-09-21 NOTE — Progress Notes (Signed)
CLINICAL SOCIAL WORK MATERNAL/CHILD NOTE  Patient Details  Name: Amy Delgado MRN: [id number] Date of Birth: 09/21/2015  Date: 09/[birth year]/2014  Clinical Social Worker Initiating Note: Johnnye Lana, LCSWDate/ Time Initiated: 09/21/14/1300   Child's Name: Amy Delgado   Legal Guardian:  (Parents Amy Delgado and Amy Delgado)   Need for Interpreter: None   Date of Referral: 09/20/14   Reason for Referral: Other (Comment)   Referral Source: [facility]   Address: [address] [place], [place] [address]  Phone number:  [phone])   Household Members: Minor Children, Spouse   Natural Supports (not living in the home): Extended Family, Immediate Family   Professional Supports:none  Employment: (Spouse is employed)   Type of Work:     Education:     Surveyor, quantity Resources:Medicaid   Other Resources: [employer]   Cultural/Religious Considerations Which May Impact Care: none noted  Strengths: Ability to meet basic needs , Home prepared for child , Compliance with medical plan    Risk Factors/Current Problems: None   Cognitive State: Alert , Able to Concentrate    Mood/Affect: Bright , Happy    CSW Assessment: Acknowledged order for Social Work consult to assess mother's history of anxiety. MOB was pleasant and receptive to CSW. Grandparents were present and very attentive to mother. Parents are married and have 3 other dependents ages 30,7, and 36. Mother acknowledged hx of anxiety and states that she was never prescribed medication. Informed that she has learned how to control the symptoms. She reports extensive support from family. She denies any hx of substance abuse. No acute social concerns related at this time. Mother informed of social work Surveyor, mining.   CSW Plan/Description:    Spoke with her about the signs/symptoms of PP Depression, and provided information and literature on PP Depression No  current barriers to discharge    Susette Seminara J, LCSW 09/21/2014, 3:13 PM

## 2014-09-21 NOTE — Progress Notes (Signed)
Pt 36.4 in active labor. No BMZ per Dr. Langston Masker.

## 2014-09-21 NOTE — Progress Notes (Signed)
Post Partum Day 0 Subjective: no complaints, voiding and tolerating PO  Objective: Blood pressure 110/56, pulse 91, temperature 98.2 F (36.8 C), temperature source Oral, resp. rate 20, height 5' (1.524 m), weight 264 lb (119.75 kg), SpO2 100 %, unknown if currently breastfeeding.  Physical Exam:  General: alert, cooperative and appears stated age Lochia: appropriate Uterine Fundus: firm Incision: healing well, no significant drainage, no dehiscence DVT Evaluation: No evidence of DVT seen on physical exam. Negative Homan's sign. No cords or calf tenderness.   Recent Labs  09/21/14 0115  HGB 11.0*  HCT 32.3*    Assessment/Plan: Plan for discharge tomorrow and Breastfeeding   LOS: 0 days   Amy Delgado 09/21/2014, 12:00 PM

## 2014-09-21 NOTE — Anesthesia Preprocedure Evaluation (Addendum)
Anesthesia Evaluation  Patient identified by MRN, date of birth, ID band Patient awake    Reviewed: Allergy & Precautions, NPO status , Patient's Chart, lab work & pertinent test results  Airway Mallampati: III  TM Distance: >3 FB Neck ROM: Full    Dental no notable dental hx.    Pulmonary neg pulmonary ROS,  breath sounds clear to auscultation  Pulmonary exam normal       Cardiovascular negative cardio ROS Normal cardiovascular examRhythm:Regular Rate:Normal     Neuro/Psych Anxiety negative neurological ROS     GI/Hepatic negative GI ROS, Neg liver ROS,   Endo/Other  Morbid obesity  Renal/GU negative Renal ROS  negative genitourinary   Musculoskeletal negative musculoskeletal ROS (+)   Abdominal (+) + obese,   Peds negative pediatric ROS (+)  Hematology negative hematology ROS (+)   Anesthesia Other Findings   Reproductive/Obstetrics (+) Pregnancy                             Anesthesia Physical Anesthesia Plan  ASA: III  Anesthesia Plan: Epidural   Post-op Pain Management:    Induction:   Airway Management Planned:   Additional Equipment:   Intra-op Plan:   Post-operative Plan:   Informed Consent: I have reviewed the patients History and Physical, chart, labs and discussed the procedure including the risks, benefits and alternatives for the proposed anesthesia with the patient or authorized representative who has indicated his/her understanding and acceptance.   Dental advisory given  Plan Discussed with: CRNA  Anesthesia Plan Comments:         Anesthesia Quick Evaluation

## 2014-09-21 NOTE — Lactation Note (Signed)
This note was copied from the chart of Amy Delgado. Lactation Consultation Note  Patient Name: Amy Latesia Krishnamoorthy HKUVJ'D Date: 09/21/2014 Reason for consult: Initial assessment  Baby 15 hours old. Mom reports baby not latching well, and she has been trying for the last few minutes. Mom states that she did not nurse her 3 older children, and does not remember milk coming in very much. Assisted mom to latch baby in football position to right breast. Baby sleepy at breast and would not wake to latch. Mom given LPI sheet earlier by bedside nurse. Mom states that she understands LPI behavior.   Plan is for mom to put baby to breast first, at least every 3 hours. Enc parents to supplement baby with EBM/FO according to supplementation guidelines. Enc limiting attempts at breast to 10 minutes and then total feeding time to 30 minutes. Mom given [facility] brochure, aware of OP/BFSG, community resources, and [facility] phone line assistance after D/C. Discussed assessment, intervention, and feeding plan with patient's RN Nehemiah Settle. Maternal Data Has patient been taught Hand Expression?: Yes Does the patient have breastfeeding experience prior to this delivery?: No  Feeding Feeding Type: Breast Fed Length of feed: 0 min  LATCH Score/Interventions Latch: Too sleepy or reluctant, no latch achieved, no sucking elicited. Intervention(s): Skin to skin;Teach feeding cues;Waking techniques  Audible Swallowing: None Intervention(s): Skin to skin;Hand expression  Type of Nipple: Everted at rest and after stimulation (Short shaft.)  Comfort (Breast/Nipple): Soft / non-tender     Hold (Positioning): Assistance needed to correctly position infant at breast and maintain latch. Intervention(s): Breastfeeding basics reviewed;Support Pillows;Position options;Skin to skin  LATCH Score: 5  Lactation Tools Discussed/Used Tools: Pump Breast pump type: Double-Electric Breast Pump Pump Review: Setup, frequency, and  cleaning;Milk Storage Initiated by:: Bedside nurse.  Date initiated:: 09/21/14   Consult Status Consult Status: Follow-up Date: 09/21/14 Follow-up type: In-patient    Geralynn Ochs 09/21/2014, 6:06 PM

## 2014-09-22 LAB — CBC
HEMATOCRIT: 30.7 % — AB (ref 36.0–46.0)
Hemoglobin: 10.2 g/dL — ABNORMAL LOW (ref 12.0–15.0)
MCH: 24.8 pg — ABNORMAL LOW (ref 26.0–34.0)
MCHC: 33.2 g/dL (ref 30.0–36.0)
MCV: 74.7 fL — ABNORMAL LOW (ref 78.0–100.0)
Platelets: 244 10*3/uL (ref 150–400)
RBC: 4.11 MIL/uL (ref 3.87–5.11)
RDW: 16.7 % — AB (ref 11.5–15.5)
WBC: 12.6 10*3/uL — ABNORMAL HIGH (ref 4.0–10.5)

## 2014-09-22 MED ORDER — OXYCODONE-ACETAMINOPHEN 5-325 MG PO TABS: 1.0000 | ORAL_TABLET | ORAL | Status: DC | PRN

## 2014-09-22 MED ORDER — IBUPROFEN 600 MG PO TABS: 600.0000 mg | ORAL_TABLET | Freq: Four times a day (QID) | ORAL | Status: DC

## 2014-09-22 NOTE — Discharge Summary (Signed)
Obstetric Discharge Summary Reason for Admission: onset of labor Prenatal Procedures: none Intrapartum Procedures: spontaneous vaginal delivery Postpartum Procedures: none Complications-Operative and Postpartum: none HEMOGLOBIN  Date Value Ref Range Status  09/22/2014 10.2* 12.0 - 15.0 g/dL Final   HCT  Date Value Ref Range Status  09/22/2014 30.7* 36.0 - 46.0 % Final    Physical Exam:  General: alert, cooperative and appears stated age Lochia: appropriate Uterine Fundus: firm Incision: healing well, no significant drainage, no dehiscence DVT Evaluation: No evidence of DVT seen on physical exam. Negative Homan's sign. No cords or calf tenderness.  Discharge Diagnoses: Term Pregnancy-delivered  Discharge Information: Date: 09/22/2014 Activity: pelvic rest Diet: routine Medications: PNV, Ibuprofen and Percocet Condition: stable Instructions: refer to practice specific booklet Discharge to: home   Newborn Data: Live born female  Birth Weight: 6 lb 7.6 oz (2937 g) APGAR: 9, 9  Home with mother.  Amy Delgado 09/22/2014, 9:58 AM

## 2014-09-22 NOTE — Discharge Instructions (Signed)
Call MD for T>100.4, heavy vaginal bleeding, severe abdominal pain, intractable nausea and/or vomiting, or respiratory distress.  Call office to schedule postpartum appointment in 4-6 weeks.  No driving while taking narcotics.  Pelvic rest x 6 weeks.

## 2014-09-22 NOTE — Lactation Note (Signed)
This note was copied from the chart of Amy Delgado. Lactation Consultation Note  Mother states she recently bf baby for 15 min.  Giving formula bottle upon entering room. Baby received 12 ml. Offered to assist mother w/ latching baby.  Attempted in both football and cross cradle.  Baby latched briefly in cross cradle and fell asleep. Reviewed volume guidelines with parents.  Suggest baby feed STS with hat on. Reminded mother to breastfeed before giving supplement and post pump 4-6 times a day to stimulate her milk supply.    Patient Name: Amy Terin Kahrs UPJSR'P Date: 09/22/2014 Reason for consult: Late preterm infant   Maternal Data    Feeding Feeding Type: Breast Fed Length of feed: 15 min  LATCH Score/Interventions Latch: Repeated attempts needed to sustain latch, nipple held in mouth throughout feeding, stimulation needed to elicit sucking reflex.  Audible Swallowing: A few with stimulation  Type of Nipple: Everted at rest and after stimulation  Comfort (Breast/Nipple): Soft / non-tender     Hold (Positioning): Assistance needed to correctly position infant at breast and maintain latch.  LATCH Score: 7  Lactation Tools Discussed/Used     Consult Status Consult Status: Follow-up Date: 09/23/14 Follow-up type: In-patient    Dahlia Byes [facility] 09/22/2014, 11:55 AM

## 2014-09-22 NOTE — Progress Notes (Signed)
Post Partum Day 1 Subjective: no complaints, up ad lib, voiding and tolerating PO.  Requests early discharge.  Objective: Blood pressure 122/68, pulse 81, temperature 98 F (36.7 C), temperature source Oral, resp. rate 18, height 5' (1.524 m), weight 264 lb (119.75 kg), SpO2 99 %, unknown if currently breastfeeding.  Physical Exam:  General: alert, cooperative and appears stated age Lochia: appropriate Uterine Fundus: firm Incision: healing well, no significant drainage, no dehiscence DVT Evaluation: No evidence of DVT seen on physical exam. Negative Homan's sign. No cords or calf tenderness.   Recent Labs  09/21/14 0115 09/22/14 0550  HGB 11.0* 10.2*  HCT 32.3* 30.7*    Assessment/Plan: Discharge home and Breastfeeding   LOS: 1 day   Linlee Cromie 09/22/2014, 9:55 AM

## 2014-09-23 ENCOUNTER — Ambulatory Visit: Payer: Self-pay

## 2014-09-23 NOTE — Lactation Note (Signed)
This note was copied from the chart of Amy Ceairra Dollens. Lactation Consultation Note  Patient Name: Amy Delgado MIWOE'H Date: 09/23/2014 Reason for consult: Follow-up assessment;Other (Comment) (5% weight loss, per mom baby recently BF )  Baby is 87 hour old , late pre-term infant , at 5% weight loss, voids and stools adequate. Latch scores= 9-10  Breast feeding range -   15 - 20 mins,  and supplementing with formula 10 -22 ml . Last fed at 0843.  @45  hrs. 8.2.  LC reviewed potential feeding behaviors of a late preterm infant, sore nipple and engorgement prevention and tx. Per mom will have a DEBP at home , besides the manual pump from the hospital.  [facility] offered mom an LC O/P apt, and mom requested to call back for an apt when she gets settled at home. Mother informed of post-discharge support and given phone number to the lactation department, including services  for phone call assistance; out-patient appointments; and breastfeeding support group. List of other breastfeeding resources  in the community given in the handout. Encouraged mother to call for problems or concerns related to breastfeeding.Mother  informed of post-discharge support and given phone number to the lactation department, including services for phone call  assistance; out-patient appointments; and breastfeeding support group. List of other breastfeeding resources in the community  given in the handout. Encouraged mother to call for problems or concerns related to breastfeeding.    Maternal Data    Feeding Feeding Type: Breast Fed  LATCH Score/Interventions Latch: Grasps breast easily, tongue down, lips flanged, rhythmical sucking.  Audible Swallowing: Spontaneous and intermittent  Type of Nipple: Everted at rest and after stimulation  Comfort (Breast/Nipple): Soft / non-tender     Hold (Positioning): No assistance needed to correctly position infant at breast. Intervention(s): Breastfeeding basics  reviewed  LATCH Score: 10  Lactation Tools Discussed/Used     Consult Status Consult Status: Complete (LC Offered LC O/P apt ,per mom will call office when aware of other apts.) Date: 09/23/14    Kathrin Greathouse 09/23/2014, 10:02 AM

## 2014-09-25 NOTE — Addendum Note (Signed)
Addendum  created 09/25/14 1933 by Cyrah Mclamb M Kaisen Ackers, RN   Modules edited: Anesthesia LDA, Lines/Drains/Airways Properties Editor   Lines/Drains/Airways Properties Editor:  Properties of line/drain/airway/wound [REMOVED] Perineural (Nerve Sheath) Catheter 09/21/14 have been modified.    

## 2016-12-29 ENCOUNTER — Ambulatory Visit (HOSPITAL_COMMUNITY)
Admission: EM | Admit: 2016-12-29 | Discharge: 2016-12-29 | Disposition: A | Discharge status: Home / Self Care | Payer: Medicaid Other | Attending: Family Medicine | Admitting: Family Medicine

## 2016-12-29 ENCOUNTER — Encounter (HOSPITAL_COMMUNITY): Payer: Self-pay | Admitting: *Deleted

## 2016-12-29 DIAGNOSIS — R509 Fever, unspecified: Secondary | ICD-10-CM

## 2016-12-29 DIAGNOSIS — K529 Noninfective gastroenteritis and colitis, unspecified: Secondary | ICD-10-CM

## 2016-12-29 LAB — POCT URINALYSIS DIP (DEVICE)
Bilirubin Urine: NEGATIVE
Glucose, UA: NEGATIVE mg/dL
Ketones, ur: NEGATIVE mg/dL
Leukocytes, UA: NEGATIVE
NITRITE: NEGATIVE
PH: 8.5 — AB (ref 5.0–8.0)
PROTEIN: NEGATIVE mg/dL
Specific Gravity, Urine: 1.015 (ref 1.005–1.030)
UROBILINOGEN UA: 0.2 mg/dL (ref 0.0–1.0)

## 2016-12-29 LAB — POCT PREGNANCY, URINE: Preg Test, Ur: NEGATIVE

## 2016-12-29 MED ORDER — ONDANSETRON 4 MG PO TBDP: 4.0000 mg | ORAL_TABLET | Freq: Three times a day (TID) | ORAL | 0 refills | Status: DC | PRN

## 2016-12-29 MED ORDER — ACETAMINOPHEN 325 MG PO TABS
650.0000 mg | ORAL_TABLET | Freq: Once | ORAL | Status: AC
  Administered 2016-12-29: 650 mg via ORAL

## 2016-12-29 MED ORDER — ONDANSETRON 4 MG PO TBDP
ORAL_TABLET | ORAL | Status: AC
  Filled 2016-12-29: qty 2

## 2016-12-29 MED ORDER — ACETAMINOPHEN 325 MG PO TABS
ORAL_TABLET | ORAL | Status: AC
  Filled 2016-12-29: qty 2

## 2016-12-29 MED ORDER — ONDANSETRON 4 MG PO TBDP
8.0000 mg | ORAL_TABLET | Freq: Once | ORAL | Status: AC
  Administered 2016-12-29: 8 mg via ORAL

## 2016-12-29 NOTE — ED Triage Notes (Signed)
Pt  Reports   Symptoms  Of  fever   Vomiting     And  Lightheaded       Since  Yesterday    Pt  States   She  Was  Treated  For  uti   And  Or  bv    2  Days  Ago  At  A  womens   Clinic       She  States   She   Was  Given 2  rx      One  Was  Flagyl  And  The  Other  Was  An  unknown  Antibiotic     She  Denies  Any  abd pain    She  Reported  A  Slight   Discharge  With a  Foul odor

## 2016-12-29 NOTE — Discharge Instructions (Signed)
Please return here or to the Emergency Department immediately should you feel worse in any way or have any of the following symptoms: increasing or different abdominal pain, persistent vomiting, fevers, or shaking chills. Please follow up here or with your primary care doctor for a recheck in 24 hours in order to ensure that you are not developing a problem that might require surgery or hospitalization.

## 2016-12-30 NOTE — ED Provider Notes (Signed)
[facility] CARE CENTER   [id number] 12/29/16 Arrival Time: 1647  ASSESSMENT & PLAN:  1. Fever and chills   2. Gastroenteritis     Meds ordered this encounter  Medications  . ondansetron (ZOFRAN-ODT) disintegrating tablet 8 mg  . acetaminophen (TYLENOL) tablet 650 mg  . ondansetron (ZOFRAN-ODT) 4 MG disintegrating tablet    Sig: Take 1 tablet (4 mg total) by mouth every 8 (eight) hours as needed for nausea or vomiting.    Dispense:  15 tablet    Refill:  0   Will f/u 24-48 hours if not improving, ED if needed. Will do her best to ensure adequate fluid intake. Reviewed expectations re: course of current medical issues. Questions answered. Outlined signs and symptoms indicating need for more acute intervention. Patient verbalized understanding. After Visit Summary given.   SUBJECTIVE:  Amy Delgado is a 31 y.o. female who presents with complaint of mild abdominal discomfort after experiencing emesis and loose stools for 1-2 days. Subjective fever. Mild lightheadedness. Aggravating factors: eating. Alleviating factors: none. The patient denies arthralgias, chills, dysuria, frequency and hematuria. Appetite: decreased. PO intake: decreased. Ambulatory without assistance. Urinary symptoms: none. OTC treatment: None. Overall fatigued.  Patient's last menstrual period was 12/29/2016.  No sick contacts.  Past Surgical History:  Procedure Laterality Date  . surgery on lip  1994   stabbed with screwdriver    ROS: As per HPI.  OBJECTIVE:  Vitals:   12/29/16 1747  BP: 112/72  Pulse: (!) 120  Resp: (!) 22  Temp: (!) 102.4 F (39.1 C)  TempSrc: Oral  SpO2: 97%    General appearance: alert; no distress; febrile Lungs: clear to auscultation bilaterally Heart: regular rate and rhythm Abdomen: soft; non-distended; no tenderness; bowel sounds present; no masses or organomegaly; no guarding or rebound tenderness Back: no CVA tenderness Extremities: no cyanosis or edema;  symmetrical with no gross deformities Skin: warm and dry Neurologic: normal gait Psychological: alert and cooperative; normal mood and affect  Labs: Results for orders placed or performed during the hospital encounter of 12/29/16  POCT urinalysis dip (device)  Result Value Ref Range   Glucose, UA NEGATIVE NEGATIVE mg/dL   Bilirubin Urine NEGATIVE NEGATIVE   Ketones, ur NEGATIVE NEGATIVE mg/dL   Specific Gravity, Urine 1.015 1.005 - 1.030   Hgb urine dipstick MODERATE (A) NEGATIVE   pH 8.5 (H) 5.0 - 8.0   Protein, ur NEGATIVE NEGATIVE mg/dL   Urobilinogen, UA 0.2 0.0 - 1.0 mg/dL   Nitrite NEGATIVE NEGATIVE   Leukocytes, UA NEGATIVE NEGATIVE  Pregnancy, urine POC  Result Value Ref Range   Preg Test, Ur NEGATIVE NEGATIVE   Labs Reviewed  POCT URINALYSIS DIP (DEVICE) - Abnormal; Notable for the following:       Result Value   Hgb urine dipstick MODERATE (*)    pH 8.5 (*)    All other components within normal limits  POCT PREGNANCY, URINE    No Known Allergies                                             Past Medical History:  Diagnosis Date  . Anxiety   . Vaginal Pap smear, abnormal    Social History   Social History  . Marital status: Married    Spouse name: N/A  . Number of children: N/A  . Years of education: N/A  Occupational History  . Not on file.   Social History Main Topics  . Smoking status: Never Smoker  . Smokeless tobacco: Not on file  . Alcohol use Yes     Comment: occasional  . Drug use: No  . Sexual activity: Yes    Birth control/ protection: Implant   Other Topics Concern  . Not on file   Social History Narrative  . No narrative on file   Family History  Problem Relation Age of Onset  . Diabetes Father      Mardella Layman, MD 12/30/16 [phone]

## 2017-04-05 NOTE — L&D Delivery Note (Signed)
Delivery Note At 4:02 AM a viable female was delivered via OA Presentation   APGAR: 9 9  weight pending  .   Placenta status:spontaneously with 3 vessel cord , .  Cord:  with the following complications: none.  Cord pH: not obtained  Anesthesia:  none Episiotomy:none   Lacerations: none  Suture Repair: not applicable Est. Blood Loss (mL):  300  Mom to postpartum.  Baby to Couplet care / Skin to Skin.  Tonnia Bardin L 12/18/2017, 4:12 AM

## 2017-06-15 ENCOUNTER — Telehealth: Payer: Self-pay | Admitting: Cardiology

## 2017-06-15 NOTE — Telephone Encounter (Signed)
[facility], Appt 06/17/17 @ 2:40pm. NV

## 2017-06-16 NOTE — Progress Notes (Signed)
Cardiology Office Note   Date:  06/17/2017   ID:  Amy Delgado, DOB [birth year], MRN [id number]  PCP:  Mitchel Honour, DO  Cardiologist:   Peityn Payton [place], MD   Chief Complaint  Patient presents with  . Follow-up    [redacted] weeks pregnant.  . Shortness of Breath  . Chest Pain      History of Present Illness: Amy Delgado is a 32 y.o. female who is seen at the request of Mitchel Honour MD for evaluation of dyspnea. She is now 2 months pregnant. She had 4 prior successful pregnancies without difficulty. She is obese. Thinks her SOB has worsened significantly since she has been pregnant. Denies any cough, fever, edema, weight gain. No chest pain but has occasional heaviness. No wheezing. SOB is all the time but worse with exertion. No history of murmur.   Past Medical History:  Diagnosis Date  . Anxiety   . Vaginal Pap smear, abnormal     Past Surgical History:  Procedure Laterality Date  . surgery on lip  1994   stabbed with screwdriver     Current Outpatient Medications  Medication Sig Dispense Refill  . ibuprofen (ADVIL,MOTRIN) 600 MG tablet Take 1 tablet (600 mg total) by mouth every 6 (six) hours. 30 tablet 0  . ondansetron (ZOFRAN-ODT) 4 MG disintegrating tablet Take 1 tablet (4 mg total) by mouth every 8 (eight) hours as needed for nausea or vomiting. 15 tablet 0  . Prenatal Vit-Fe Fumarate-FA (PRENATAL MULTIVITAMIN) TABS tablet Take 1 tablet by mouth daily at 12 noon.    . IRON PO Take 1 tablet by mouth daily.     No current facility-administered medications for this visit.     Allergies:   Patient has no known allergies.    Social History:  The patient  reports that  has never smoked. she has never used smokeless tobacco. She reports that she drinks alcohol. She reports that she does not use drugs.   Family History:  The patient's family history includes Diabetes in her father.    ROS:  Please see the history of present illness.   Otherwise, review of systems  are positive for none.   All other systems are reviewed and negative.    PHYSICAL EXAM: VS:  BP 120/80 Comment: Right arm.  Pulse 100   Ht 5' (1.524 m)   Wt 255 lb (115.7 kg)   BMI 49.80 kg/m  , BMI Body mass index is 49.8 kg/m. GEN: Well nourished, morbidly obese BF, in no acute distress  HEENT: normal  Neck: no JVD, carotid bruits, or masses Cardiac: RRR; no murmurs, rubs, or gallops,no edema  Respiratory:  clear to auscultation bilaterally, normal work of breathing GI: soft, nontender, nondistended, + BS, obese MS: no deformity or atrophy  Skin: warm and dry, no rash Neuro:  Strength and sensation are intact Psych: euthymic mood, full affect   EKG:  EKG is ordered today. The ekg ordered today demonstrates NSR rate 100. Normal Ecg. I have personally reviewed and interpreted this study.    Recent Labs: No results found for requested labs within last 8760 hours.    Lipid Panel No results found for: CHOL, TRIG, HDL, CHOLHDL, VLDL, LDLCALC, LDLDIRECT    Wt Readings from Last 3 Encounters:  06/17/17 255 lb (115.7 kg)  09/21/14 264 lb (119.7 kg)  08/04/14 261 lb (118.4 kg)      Other studies Reviewed: Additional studies/ records that were reviewed today include: none  ASSESSMENT AND PLAN:  1.  Dyspnea. Most likely related to morbid obesity. Epworth sleep scale low risk for sleep apnea. Peripartum CM a possibility. Will arrange for Echo. If Echo is normal recommend efforts at weight loss and regular exercise.    Current medicines are reviewed at length with the patient today.  The patient does not have concerns regarding medicines.  The following changes have been made:  no change  Labs/ tests ordered today include:   Orders Placed This Encounter  Procedures  . EKG 12-Lead  . ECHOCARDIOGRAM COMPLETE     Disposition:   TBD  Signed, Taite Baldassari [place], MD  06/17/2017 3:20 PM    [facility] [address], [place], [place],  [id number] Phone [phone], Fax [phone]

## 2017-06-17 ENCOUNTER — Encounter: Payer: Self-pay | Admitting: Cardiology

## 2017-06-17 ENCOUNTER — Ambulatory Visit: Payer: Medicaid Other | Admitting: Cardiology

## 2017-06-17 ENCOUNTER — Ambulatory Visit: Payer: Medicaid Other | Admitting: Cardiovascular Disease

## 2017-06-17 VITALS — BP 120/80 | HR 100 | Ht 60.0 in | Wt 255.0 lb

## 2017-06-17 DIAGNOSIS — R0602 Shortness of breath: Secondary | ICD-10-CM

## 2017-06-17 NOTE — Patient Instructions (Signed)
Schedule Echo

## 2017-06-28 ENCOUNTER — Ambulatory Visit (HOSPITAL_COMMUNITY): Payer: Medicaid Other | Attending: Cardiology

## 2017-06-28 ENCOUNTER — Other Ambulatory Visit: Payer: Self-pay

## 2017-06-28 DIAGNOSIS — Z6841 Body Mass Index (BMI) 40.0 and over, adult: Secondary | ICD-10-CM | POA: Diagnosis not present

## 2017-06-28 DIAGNOSIS — R0602 Shortness of breath: Secondary | ICD-10-CM | POA: Insufficient documentation

## 2017-07-01 ENCOUNTER — Telehealth: Payer: Self-pay | Admitting: Cardiology

## 2017-07-01 NOTE — Telephone Encounter (Signed)
Spoke to patient echo results given. 

## 2017-07-01 NOTE — Telephone Encounter (Signed)
Amy Delgado is returning a call . Thanks

## 2017-09-14 ENCOUNTER — Ambulatory Visit: Payer: Medicaid Other | Admitting: Physician Assistant

## 2017-09-14 ENCOUNTER — Encounter: Payer: Self-pay | Admitting: Physician Assistant

## 2017-09-14 VITALS — BP 120/84 | HR 118 | Ht 61.0 in | Wt 251.6 lb

## 2017-09-14 DIAGNOSIS — R002 Palpitations: Secondary | ICD-10-CM

## 2017-09-14 DIAGNOSIS — Z3490 Encounter for supervision of normal pregnancy, unspecified, unspecified trimester: Secondary | ICD-10-CM

## 2017-09-14 DIAGNOSIS — R0600 Dyspnea, unspecified: Secondary | ICD-10-CM

## 2017-09-14 NOTE — Patient Instructions (Addendum)
Medication Instructions:  Your physician recommends that you continue on your current medications as directed. Please refer to the Current Medication list given to you today.  Labwork: None   Testing/Procedures: Your physician has recommended that you wear a 48 hour holter monitor. Holter monitors are medical devices that record the heart's electrical activity. Doctors most often use these monitors to diagnose arrhythmias. Arrhythmias are problems with the speed or rhythm of the heartbeat. The monitor is a small, portable device. You can wear one while you do your normal daily activities. This is usually used to diagnose what is causing palpitations/syncope (passing out). [address]  Follow-Up: Follow up as needed unless your monitor results are abnormal then we will contact you for further instructions.  Any Other Special Instructions Will Be Listed Below (If Applicable).     If you need a refill on your cardiac medications before your next appointment, please call your pharmacy.

## 2017-09-14 NOTE — Progress Notes (Signed)
Cardiology Office Note    Date:  09/14/2017   ID:  [place], Delong [birth year], MRN [id number]  PCP:  Mitchel Honour, DO  Cardiologist:  Dr. Swaziland   Chief Complaint  Patient presents with  . Follow-up    seen for Dr. Swaziland.     History of Present Illness:  Amy Delgado is a 32 y.o. female with PMH of dyspnea.  Patient was last seen by Dr. Swaziland in March 2019 for dyspnea.  She has a prior history of 4 successful pregnancies without difficulty.  She is obese with weight of 250 pounds.  During the last office visit, she complained of worsening shortness of breath since the beginning of her pregnancy.  Echocardiogram performed on 06/28/2017 showed EF 55 to 60%, no regional wall motion abnormality.  Patient presents today for evaluation of palpitation.  She says occasionally she is experienced a spontaneous increase of heart rate.  This typically occurs after she eats breakfast.  She does not drink significant caffeinated drink, in the past few weeks, she only had one soda.  She says the palpitation typically last about an hour and tend to self resolve.  She denies any associated symptoms such as chest pain, dizziness, or feeling of passing out.  Her palpitation episode now occurs at that daily frequency, I recommended 48-hour Holter monitor, as long as there is no dangerous type of irregular rhythm, I would like to avoid beta-blockers in this patient.   Past Medical History:  Diagnosis Date  . Anxiety   . Vaginal Pap smear, abnormal     Past Surgical History:  Procedure Laterality Date  . surgery on lip  1994   stabbed with screwdriver    Current Medications: Outpatient Medications Prior to Visit  Medication Sig Dispense Refill  . ibuprofen (ADVIL,MOTRIN) 600 MG tablet Take 1 tablet (600 mg total) by mouth every 6 (six) hours. 30 tablet 0  . IRON PO Take 1 tablet by mouth daily.    . ondansetron (ZOFRAN-ODT) 4 MG disintegrating tablet Take 1 tablet (4 mg total) by mouth  every 8 (eight) hours as needed for nausea or vomiting. 15 tablet 0  . Prenatal Vit-Fe Fumarate-FA (PRENATAL MULTIVITAMIN) TABS tablet Take 1 tablet by mouth daily at 12 noon.     No facility-administered medications prior to visit.      Allergies:   Patient has no known allergies.   Social History   Socioeconomic History  . Marital status: Married    Spouse name: Not on file  . Number of children: 4  . Years of education: Not on file  . Highest education level: Not on file  Occupational History  . Not on file  Social Needs  . Financial resource strain: Not on file  . Food insecurity:    Worry: Not on file    Inability: Not on file  . Transportation needs:    Medical: Not on file    Non-medical: Not on file  Tobacco Use  . Smoking status: Never Smoker  . Smokeless tobacco: Never Used  Substance and Sexual Activity  . Alcohol use: Yes    Comment: occasional  . Drug use: No  . Sexual activity: Yes    Birth control/protection: Implant  Lifestyle  . Physical activity:    Days per week: Not on file    Minutes per session: Not on file  . Stress: Not on file  Relationships  . Social connections:    Talks on phone: Not  on file    Gets together: Not on file    Attends religious service: Not on file    Active member of club or organization: Not on file    Attends meetings of clubs or organizations: Not on file    Relationship status: Not on file  Other Topics Concern  . Not on file  Social History Narrative  . Not on file     Family History:  The patient's family history includes Diabetes in her father.   ROS:   Please see the history of present illness.    ROS All other systems reviewed and are negative.   PHYSICAL EXAM:   VS:  BP 120/84   Pulse (!) 118   Ht 5\' 1"  (1.549 m)   Wt 251 lb 9.6 oz (114.1 kg)   SpO2 99%   BMI 47.54 kg/m    GEN: Well nourished, well developed, in no acute distress  HEENT: normal  Neck: no JVD, carotid bruits, or masses Cardiac:  Mildly tachycardic; no murmurs, rubs, or gallops,no edema  Respiratory:  clear to auscultation bilaterally, normal work of breathing GI: soft, nontender, nondistended, + BS MS: no deformity or atrophy  Skin: warm and dry, no rash Neuro:  Alert and Oriented x 3, Strength and sensation are intact Psych: euthymic mood, full affect  Wt Readings from Last 3 Encounters:  09/14/17 251 lb 9.6 oz (114.1 kg)  06/17/17 255 lb (115.7 kg)  09/21/14 264 lb (119.7 kg)      Studies/Labs Reviewed:   EKG:  EKG is ordered today.  The ekg ordered today demonstrates sinus tachycardia.  Recent Labs: No results found for requested labs within last 8760 hours.   Lipid Panel No results found for: CHOL, TRIG, HDL, CHOLHDL, VLDL, LDLCALC, LDLDIRECT  Additional studies/ records that were reviewed today include:   Echo 06/28/2017 LV EF: 55% -   60%  Study Conclusions  - Left ventricle: The cavity size was normal. Systolic function was   normal. The estimated ejection fraction was in the range of 55%   to 60%. Wall motion was normal; there were no regional wall   motion abnormalities. Left ventricular diastolic function   parameters were normal.   ASSESSMENT:    1. Palpitations   2. Dyspnea, unspecified type   3. Pregnancy, unspecified gestational age      PLAN:  In order of problems listed above:  1. Palpitation: She is having occasional onset of prolonged tachycardia palpitation that lasted up to hour.  This typically occurs after breakfast and does not have clear correlation with exertion.  Symptoms tend to self resolve and is not accompanied by any dizziness, blurred vision or feeling of passing out.  I recommended a 48-hour Holter monitor to further assess, if no significant irregular rhythm is noted, I would recommend continue observation.  I do not recommend beta-blocker therapy unless her heart at rest is greater than 140.  According to the patient, her OB/GYN physician recently checked  her thyroid.  2. Dyspnea: Recent echocardiogram is reassuring.  Likely related to current pregnancy and also her obesity.  Recommend weight loss.  Expect symptoms to improve after her delivery  3. Pregnancy: She is currently [redacted] weeks pregnant.  This also likely contribute to her palpitation sensation as well.      Medication Adjustments/Labs and Tests Ordered: Current medicines are reviewed at length with the patient today.  Concerns regarding medicines are outlined above.  Medication changes, Labs and Tests ordered today are  listed in the Patient Instructions below. Patient Instructions  Medication Instructions:  Your physician recommends that you continue on your current medications as directed. Please refer to the Current Medication list given to you today.  Labwork: None   Testing/Procedures: Your physician has recommended that you wear a 48 hour holter monitor. Holter monitors are medical devices that record the heart's electrical activity. Doctors most often use these monitors to diagnose arrhythmias. Arrhythmias are problems with the speed or rhythm of the heartbeat. The monitor is a small, portable device. You can wear one while you do your normal daily activities. This is usually used to diagnose what is causing palpitations/syncope (passing out). [address]  Follow-Up: Follow up as needed unless your monitor results are abnormal then we will contact you for further instructions.  Any Other Special Instructions Will Be Listed Below (If Applicable).     If you need a refill on your cardiac medications before your next appointment, please call your pharmacy.     Ramond DialSigned, Lazlo Tunney, [place]  09/14/2017 5:47 PM    [facility] [address], [place], [place]  [address] Phone: [phone]; Fax: [phone]

## 2017-09-26 ENCOUNTER — Ambulatory Visit (INDEPENDENT_AMBULATORY_CARE_PROVIDER_SITE_OTHER): Payer: Medicaid Other

## 2017-09-26 DIAGNOSIS — R002 Palpitations: Secondary | ICD-10-CM

## 2017-11-16 ENCOUNTER — Other Ambulatory Visit (HOSPITAL_COMMUNITY): Payer: Self-pay | Admitting: *Deleted

## 2017-11-17 ENCOUNTER — Ambulatory Visit (HOSPITAL_COMMUNITY)
Admission: RE | Admit: 2017-11-17 | Discharge: 2017-11-17 | Disposition: A | Discharge status: Home / Self Care | Payer: Medicaid Other | Source: Ambulatory Visit | Attending: Obstetrics and Gynecology | Admitting: Obstetrics and Gynecology

## 2017-11-17 DIAGNOSIS — O99012 Anemia complicating pregnancy, second trimester: Secondary | ICD-10-CM | POA: Diagnosis present

## 2017-11-17 MED ORDER — SODIUM CHLORIDE 0.9 % IV SOLN
510.0000 mg | INTRAVENOUS | Status: DC
  Administered 2017-11-17: 510 mg via INTRAVENOUS
  Filled 2017-11-17: qty 17

## 2017-11-17 NOTE — Discharge Instructions (Signed)

## 2017-11-21 ENCOUNTER — Encounter (HOSPITAL_COMMUNITY)
Admission: RE | Admit: 2017-11-21 | Discharge: 2017-11-21 | Disposition: A | Discharge status: Home / Self Care | Payer: Medicaid Other | Source: Ambulatory Visit | Attending: Obstetrics and Gynecology | Admitting: Obstetrics and Gynecology

## 2017-11-21 DIAGNOSIS — O99012 Anemia complicating pregnancy, second trimester: Secondary | ICD-10-CM | POA: Insufficient documentation

## 2017-11-21 MED ORDER — SODIUM CHLORIDE 0.9 % IV SOLN
510.0000 mg | INTRAVENOUS | Status: AC
  Administered 2017-11-21: 510 mg via INTRAVENOUS
  Filled 2017-11-21: qty 17

## 2017-12-18 ENCOUNTER — Encounter (HOSPITAL_COMMUNITY): Payer: Self-pay | Admitting: *Deleted

## 2017-12-18 ENCOUNTER — Inpatient Hospital Stay (HOSPITAL_COMMUNITY)
Admission: AD | Admit: 2017-12-18 | Discharge: 2017-12-20 | DRG: 807 | Disposition: A | Discharge status: Home / Self Care | Payer: Medicaid Other | Attending: Obstetrics and Gynecology | Admitting: Obstetrics and Gynecology

## 2017-12-18 ENCOUNTER — Other Ambulatory Visit: Payer: Self-pay

## 2017-12-18 DIAGNOSIS — Z3A34 34 weeks gestation of pregnancy: Secondary | ICD-10-CM

## 2017-12-18 HISTORY — DX: Anemia, unspecified: D64.9

## 2017-12-18 HISTORY — DX: Obesity, unspecified: E66.9

## 2017-12-18 LAB — CBC
HCT: 32.5 % — ABNORMAL LOW (ref 36.0–46.0)
HCT: 32.7 % — ABNORMAL LOW (ref 36.0–46.0)
HEMOGLOBIN: 10.9 g/dL — AB (ref 12.0–15.0)
Hemoglobin: 10.8 g/dL — ABNORMAL LOW (ref 12.0–15.0)
MCH: 25.2 pg — AB (ref 26.0–34.0)
MCH: 25.3 pg — AB (ref 26.0–34.0)
MCHC: 33.2 g/dL (ref 30.0–36.0)
MCHC: 33.3 g/dL (ref 30.0–36.0)
MCV: 75.9 fL — ABNORMAL LOW (ref 78.0–100.0)
MCV: 76 fL — AB (ref 78.0–100.0)
PLATELETS: 209 10*3/uL (ref 150–400)
Platelets: 208 10*3/uL (ref 150–400)
RBC: 4.28 MIL/uL (ref 3.87–5.11)
RBC: 4.3 MIL/uL (ref 3.87–5.11)
RDW: 18.5 % — AB (ref 11.5–15.5)
RDW: 18.3 % — ABNORMAL HIGH (ref 11.5–15.5)
WBC: 13.6 10*3/uL — ABNORMAL HIGH (ref 4.0–10.5)
WBC: 17.5 10*3/uL — ABNORMAL HIGH (ref 4.0–10.5)

## 2017-12-18 LAB — TYPE AND SCREEN
ABO/RH(D): O POS
ANTIBODY SCREEN: NEGATIVE

## 2017-12-18 LAB — RPR: RPR: NONREACTIVE

## 2017-12-18 MED ORDER — ONDANSETRON HCL 4 MG/2ML IJ SOLN: 4.0000 mg | INTRAMUSCULAR | Status: DC | PRN

## 2017-12-18 MED ORDER — MEDROXYPROGESTERONE ACETATE 150 MG/ML IM SUSP: 150.0000 mg | INTRAMUSCULAR | Status: DC | PRN

## 2017-12-18 MED ORDER — WITCH HAZEL-GLYCERIN EX PADS: 1.0000 "application " | MEDICATED_PAD | CUTANEOUS | Status: DC | PRN

## 2017-12-18 MED ORDER — ONDANSETRON HCL 4 MG/2ML IJ SOLN: 4.0000 mg | Freq: Four times a day (QID) | INTRAMUSCULAR | Status: DC | PRN

## 2017-12-18 MED ORDER — SENNOSIDES-DOCUSATE SODIUM 8.6-50 MG PO TABS
2.0000 | ORAL_TABLET | ORAL | Status: DC
  Administered 2017-12-18 – 2017-12-20 (×2): 2 via ORAL
  Filled 2017-12-18 (×2): qty 2

## 2017-12-18 MED ORDER — OXYTOCIN 40 UNITS IN LACTATED RINGERS INFUSION - SIMPLE MED
INTRAVENOUS | Status: AC
  Administered 2017-12-18: 500 mL via INTRAVENOUS
  Filled 2017-12-18: qty 1000

## 2017-12-18 MED ORDER — LIDOCAINE HCL (PF) 1 % IJ SOLN
INTRAMUSCULAR | Status: AC
  Filled 2017-12-18: qty 30

## 2017-12-18 MED ORDER — BETAMETHASONE SOD PHOS & ACET 6 (3-3) MG/ML IJ SUSP
12.0000 mg | Freq: Once | INTRAMUSCULAR | Status: AC
  Administered 2017-12-18: 12 mg via INTRAMUSCULAR
  Filled 2017-12-18: qty 2

## 2017-12-18 MED ORDER — LACTATED RINGERS IV SOLN: 500.0000 mL | INTRAVENOUS | Status: DC | PRN

## 2017-12-18 MED ORDER — OXYCODONE-ACETAMINOPHEN 5-325 MG PO TABS: 1.0000 | ORAL_TABLET | ORAL | Status: DC | PRN

## 2017-12-18 MED ORDER — LIDOCAINE HCL (PF) 1 % IJ SOLN
30.0000 mL | INTRAMUSCULAR | Status: DC | PRN
  Filled 2017-12-18: qty 30

## 2017-12-18 MED ORDER — ACETAMINOPHEN 325 MG PO TABS
650.0000 mg | ORAL_TABLET | ORAL | Status: DC | PRN
  Administered 2017-12-18: 650 mg via ORAL
  Filled 2017-12-18: qty 2

## 2017-12-18 MED ORDER — BENZOCAINE-MENTHOL 20-0.5 % EX AERO: 1.0000 "application " | INHALATION_SPRAY | CUTANEOUS | Status: DC | PRN

## 2017-12-18 MED ORDER — DIBUCAINE 1 % RE OINT: 1.0000 "application " | TOPICAL_OINTMENT | RECTAL | Status: DC | PRN

## 2017-12-18 MED ORDER — SOD CITRATE-CITRIC ACID 500-334 MG/5ML PO SOLN: 30.0000 mL | ORAL | Status: DC | PRN

## 2017-12-18 MED ORDER — PRENATAL MULTIVITAMIN CH
1.0000 | ORAL_TABLET | Freq: Every day | ORAL | Status: DC
  Filled 2017-12-18 (×2): qty 1

## 2017-12-18 MED ORDER — TETANUS-DIPHTH-ACELL PERTUSSIS 5-2.5-18.5 LF-MCG/0.5 IM SUSP: 0.5000 mL | Freq: Once | INTRAMUSCULAR | Status: DC

## 2017-12-18 MED ORDER — DIPHENHYDRAMINE HCL 25 MG PO CAPS: 25.0000 mg | ORAL_CAPSULE | Freq: Four times a day (QID) | ORAL | Status: DC | PRN

## 2017-12-18 MED ORDER — IBUPROFEN 600 MG PO TABS
600.0000 mg | ORAL_TABLET | Freq: Four times a day (QID) | ORAL | Status: DC
  Administered 2017-12-18 – 2017-12-20 (×9): 600 mg via ORAL
  Filled 2017-12-18 (×9): qty 1

## 2017-12-18 MED ORDER — OXYCODONE-ACETAMINOPHEN 5-325 MG PO TABS: 2.0000 | ORAL_TABLET | ORAL | Status: DC | PRN

## 2017-12-18 MED ORDER — MEASLES, MUMPS & RUBELLA VAC ~~LOC~~ INJ
0.5000 mL | INJECTION | Freq: Once | SUBCUTANEOUS | Status: DC
  Filled 2017-12-18: qty 0.5

## 2017-12-18 MED ORDER — COCONUT OIL OIL: 1.0000 "application " | TOPICAL_OIL | Status: DC | PRN

## 2017-12-18 MED ORDER — SIMETHICONE 80 MG PO CHEW: 80.0000 mg | CHEWABLE_TABLET | ORAL | Status: DC | PRN

## 2017-12-18 MED ORDER — ACETAMINOPHEN 325 MG PO TABS: 650.0000 mg | ORAL_TABLET | ORAL | Status: DC | PRN

## 2017-12-18 MED ORDER — FLEET ENEMA 7-19 GM/118ML RE ENEM: 1.0000 | ENEMA | Freq: Every day | RECTAL | Status: DC | PRN

## 2017-12-18 MED ORDER — ZOLPIDEM TARTRATE 5 MG PO TABS: 5.0000 mg | ORAL_TABLET | Freq: Every evening | ORAL | Status: DC | PRN

## 2017-12-18 MED ORDER — OXYTOCIN BOLUS FROM INFUSION
500.0000 mL | Freq: Once | INTRAVENOUS | Status: AC
  Administered 2017-12-18: 500 mL via INTRAVENOUS

## 2017-12-18 MED ORDER — ONDANSETRON HCL 4 MG PO TABS: 4.0000 mg | ORAL_TABLET | ORAL | Status: DC | PRN

## 2017-12-18 MED ORDER — OXYTOCIN 40 UNITS IN LACTATED RINGERS INFUSION - SIMPLE MED: 2.5000 [IU]/h | INTRAVENOUS | Status: DC

## 2017-12-18 MED ORDER — BISACODYL 10 MG RE SUPP: 10.0000 mg | Freq: Every day | RECTAL | Status: DC | PRN

## 2017-12-18 MED ORDER — LACTATED RINGERS IV SOLN
INTRAVENOUS | Status: DC
  Administered 2017-12-18: 03:00:00 via INTRAVENOUS

## 2017-12-18 NOTE — MAU Provider Note (Signed)
[name] is a 32 y.o. female [phone] @ 5161w1d with a history of preterm delivery, here in MAU with contractions. Says the contractions started yesterday. She rates her pain 10/10. The pain comes and goes. She has not taken anything for the pain.    GENERAL: Well-developed, well-nourished female in no acute distress.  LUNGS: Effort normal SKIN: Warm, dry and without erythema PSYCH: Normal mood and affect Cervix: 8-9 cm, 100%, -2 Vertex, bloody show  Blood pressure 107/74, pulse (!) 119, temperature 98.1 F (36.7 C), resp. rate 18, height 5\' 4"  (1.626 m), weight 116.1 kg, last menstrual period 12/29/2016.  Fetal Tracing: Baseline: indeterminate  Variability: moderate  Accelerations: indeterminate  Decelerations: ? Variable, type of variable difficult do to contraction pattern not tracing.  Toco: indeterminate    MDM Discussed patient arrival with Dr. Vincente PoliGrewal, discussed cervical exam and fetal tracing. Dr. Vincente PoliGrewal is on her way into the hospital  Rasch, Amy RutherfordJennifer I, NP 12/18/2017 3:01 AM

## 2017-12-18 NOTE — Progress Notes (Signed)
J Rasch NP aware of pt's admission and status

## 2017-12-18 NOTE — MAU Note (Signed)
Ctxs all day. Stronger tonight. Some bloody show

## 2017-12-18 NOTE — Progress Notes (Addendum)
To 164 via stretcher. FHR 155 at transfer. Kathe MarinerJennifer Goins RN NICU notified of pt's admission.

## 2017-12-18 NOTE — Progress Notes (Signed)
Ctxs not recording well but about every 2 mins by palpation.

## 2017-12-18 NOTE — Lactation Note (Signed)
This note was copied from a baby's chart. Lactation Consultation Note  Patient Name: Amy Delgado [id number] Date: 12/18/2017 Reason for consult: Initial assessment;Late-preterm 34-36.6wks;NICU baby;Infant < 6lbs  P5 mother whose infant is now 44 hours old and in the NICU.  This is a LPTI at 34+1 weeks weighing < 6 lbs  Mother resting in bed when I arrived.  Family members visiting with her at bedside.  Mother had the DEBP at bedside.  Pump not set up correctly and flange size changed from a #24 to a #27.  Mother was concerned because she has not seen any colostrum drops yet.  She has also been doing hand expression with no drops noted.  I reassured her that she has not been pumping long and to continue to be diligent about pumping every 3 hours while awake.  Taught her how to make a hands free bra and how to do breast compressions with pumping.  Colostrum container provided for any EBM she obtains with hand expression or pumping.  Milk storage times reviewed.  NICU booklet given and mother shown milk storage times in booklet.  Mother's breasts are large, soft and non tender.  Her nipples are short shafted.  Mother stated that her nipples will evert with pumping and she has no bra here at the present time so I did not introduce breast shells.    Reviewed pump parts, assembly, disassembly and cleaning of parts.  All supplies provided and set up at bedside.  Mother's pump parts had fallen on the floor and I provided a new kit explaining the importance of putting all parts in the plastic bag I attached to the pump for cleanliness.  She is aware of how to save all colostrum drops and transporting the colostrum to the NICU.  Encouraged mother to bring pump parts to NICU when she visits and to pump at baby's bedside.  Informed her of the 2 pumping rooms that are available in the NICU for her convenience and that she can also pump at baby's bedside to help encourage milk supply.  Mother pleased to learn  this information.  She will call for any further questions/concerns she may have.   Maternal Data Formula Feeding for Exclusion: No Has patient been taught Hand Expression?: Yes Does the patient have breastfeeding experience prior to this delivery?: Yes  Feeding Feeding Type: Donor Breast Milk  LATCH Score                   Interventions    Lactation Tools Discussed/Used WIC Program: Yes Pump Review: Setup, frequency, and cleaning;Milk Storage Initiated by:: Aishia Barkey Date initiated:: 12/19/17   Consult Status Consult Status: Follow-up Date: 12/19/17 Follow-up type: In-patient    Sherron Mummert R Arrington Yohe 12/18/2017, 11:10 PM

## 2017-12-18 NOTE — H&P (Signed)
Amy Delgado is a 32 year old G 5 P 2204 at 7834 w 1 day presents in active labor with cervix at 8 cm. OB History    Gravida  5   Para  4   Term  2   Preterm  2   AB      Living  2     SAB      TAB      Ectopic      Multiple  0   Live Births  2          Past Medical History:  Diagnosis Date  . Anemia   . Anxiety   . Obesity   . Vaginal Pap smear, abnormal    Past Surgical History:  Procedure Laterality Date  . surgery on lip  1994   stabbed with screwdriver   Family History: family history includes Diabetes in her father. Social History:  reports that she has never smoked. She has never used smokeless tobacco. She reports that she drinks alcohol. She reports that she does not use drugs.     Maternal Diabetes: No Genetic Screening: Normal Maternal Ultrasounds/Referrals: Normal Fetal Ultrasounds or other Referrals:  None Maternal Substance Abuse:  No Significant Maternal Medications:  None Significant Maternal Lab Results:  None Other Comments:  None  Review of Systems  All other systems reviewed and are negative.  Maternal Medical History:  Reason for admission: Contractions.     Dilation: 8 Exam by:: Venia CarbonJennifer Rasch NP Blood pressure 107/74, pulse (!) 119, temperature 98.1 F (36.7 C), resp. rate 18, height 5\' 4"  (1.626 m), weight 116.1 kg, last menstrual period 12/29/2016. Maternal Exam:  Abdomen: Fetal presentation: vertex     Fetal Exam Fetal State Assessment: Category I - tracings are normal.     Physical Exam  Nursing note and vitals reviewed. Constitutional: She appears well-developed and well-nourished.  HENT:  Head: Normocephalic.  Eyes: Pupils are equal, round, and reactive to light.  Neck: Normal range of motion.  Cardiovascular: Normal rate.  Respiratory: Effort normal.    Prenatal labs: ABO, Rh:   Antibody:   Rubella:   RPR:    HBsAg:    HIV:    GBS:     Assessment/Plan: IUP at 34 w 1 day Active Pre term  Labor Anticipate patient to deliver soon. NICU at delivery. Discussed with patient at bedside risks of pre term delivery.   Eara Burruel L 12/18/2017, 3:25 AM

## 2017-12-19 NOTE — Lactation Note (Signed)
This note was copied from a baby's chart. Lactation Consultation Note  Patient Name: [name] GEXBM'WToday's Date: 12/19/2017 Reason for consult: Follow-up assessment;NICU baby;Late-preterm 2334-36.6wks Mom states she is pumping and hand expressing every 3 hours but not obtaining milk yet.  Reassured and instructed to continue both every 3 hours.  Mom plans on going to NICU to hold baby and will then try to pump.  [facility]  [facility].  Encouraged to call for assist/concerns prn.  Maternal Data    Feeding Feeding Type: Donor Breast Milk  LATCH Score                   Interventions    Lactation Tools Discussed/Used     Consult Status Consult Status: Follow-up Date: 12/20/17 Follow-up type: In-patient    Amy FoleyMOULDEN, Amy Delgado S 12/19/2017, 11:12 AM

## 2017-12-19 NOTE — Progress Notes (Signed)
Post Partum Day 1 Subjective: no complaints, up ad lib, voiding, tolerating PO and + flatus Baby doing well in NICU Objective: Blood pressure 120/66, pulse 79, temperature 98.9 F (37.2 C), temperature source Oral, resp. rate 19, height 5\' 4"  (1.626 m), weight 116.1 kg, last menstrual period 12/29/2016, SpO2 100 %, unknown if currently breastfeeding.  Physical Exam:  General: alert, cooperative and no distress Lochia: appropriate Uterine Fundus: firm Incision: healing well DVT Evaluation: No evidence of DVT seen on physical exam.  Recent Labs    12/18/17 0320 12/18/17 0717  HGB 10.8* 10.9*  HCT 32.5* 32.7*    Assessment/Plan: Plan for discharge tomorrow   LOS: 1 day   Roselle LocusJames E Joyel Chenette II 12/19/2017, 9:10 AM

## 2017-12-20 MED ORDER — IBUPROFEN 600 MG PO TABS: 600.0000 mg | ORAL_TABLET | Freq: Four times a day (QID) | ORAL | 0 refills | Status: DC

## 2017-12-20 NOTE — Progress Notes (Signed)
Patient discharged home with mother. Discharge instructions, home meds/prescriptions, follow-up, and reasons to seek care discussed. Pt given hand pump and instructed on option [facility] Pt verbalized understanding.

## 2017-12-20 NOTE — Lactation Note (Signed)
This note was copied from a baby's chart. Lactation Consultation Note  Patient Name: Amy Delgado ZOXWR'UToday's Date: 12/20/2017 Reason for consult: Follow-up assessment;NICU baby Mom reports obtaining small amounts of colostrum with pumping.  Pumping every 3 hours.  She will need a WIC pump prior to discharge.  Discussed bringing pump pieces to hospital to use NICU pumps.  Encouraged to call for assist/concerns.  Maternal Data    Feeding Feeding Type: Donor Breast Milk  LATCH Score                   Interventions    Lactation Tools Discussed/Used     Consult Status Consult Status: PRN Follow-up type: In-patient    [name], Pearlie Lafosse S 12/20/2017, 12:46 PM

## 2017-12-20 NOTE — Discharge Summary (Signed)
Obstetric Discharge Summary Reason for Admission: onset of labor Prenatal Procedures: none Intrapartum Procedures: spontaneous vaginal delivery Postpartum Procedures: none Complications-Operative and Postpartum: none Hemoglobin  Date Value Ref Range Status  12/18/2017 10.9 (L) 12.0 - 15.0 g/dL Final   HCT  Date Value Ref Range Status  12/18/2017 32.7 (L) 36.0 - 46.0 % Final    Physical Exam:  General: alert, cooperative, appears stated age and no distress Lochia: appropriate Uterine Fundus: firm Incision: healing well DVT Evaluation: No evidence of DVT seen on physical exam.  Discharge Diagnoses: Preterm SVD  Discharge Information: Date: 12/20/2017 Activity: pelvic rest Diet: routine Medications: Ibuprofen Condition: stable Instructions: refer to practice specific booklet Discharge to: home   Newborn Data: Live born female  Birth Weight: 5 lb 8.5 oz (2510 g) APGAR: 3, 6  Newborn Delivery   Birth date/time:  12/18/2017 04:02:00 Delivery type:  Vaginal, Spontaneous    Baby Ih NICU.  Tylea Hise C 12/20/2017, 10:59 AM

## 2018-01-28 ENCOUNTER — Encounter (HOSPITAL_COMMUNITY): Payer: Self-pay

## 2018-01-28 ENCOUNTER — Emergency Department (HOSPITAL_COMMUNITY)
Admission: EM | Admit: 2018-01-28 | Discharge: 2018-01-28 | Disposition: A | Discharge status: Home / Self Care | Payer: Medicaid Other | Attending: Emergency Medicine | Admitting: Emergency Medicine

## 2018-01-28 ENCOUNTER — Inpatient Hospital Stay (HOSPITAL_COMMUNITY)
Admission: AD | Admit: 2018-01-28 | Payer: Medicaid Other | Source: Ambulatory Visit | Admitting: Obstetrics & Gynecology

## 2018-01-28 ENCOUNTER — Other Ambulatory Visit: Payer: Self-pay

## 2018-01-28 ENCOUNTER — Emergency Department (HOSPITAL_COMMUNITY): Payer: Medicaid Other

## 2018-01-28 DIAGNOSIS — Z79899 Other long term (current) drug therapy: Secondary | ICD-10-CM | POA: Insufficient documentation

## 2018-01-28 DIAGNOSIS — Y999 Unspecified external cause status: Secondary | ICD-10-CM | POA: Diagnosis not present

## 2018-01-28 DIAGNOSIS — S0181XA Laceration without foreign body of other part of head, initial encounter: Secondary | ICD-10-CM

## 2018-01-28 DIAGNOSIS — S01112A Laceration without foreign body of left eyelid and periocular area, initial encounter: Secondary | ICD-10-CM | POA: Diagnosis not present

## 2018-01-28 DIAGNOSIS — S0990XA Unspecified injury of head, initial encounter: Secondary | ICD-10-CM | POA: Diagnosis present

## 2018-01-28 DIAGNOSIS — Y9389 Activity, other specified: Secondary | ICD-10-CM | POA: Diagnosis not present

## 2018-01-28 DIAGNOSIS — Y929 Unspecified place or not applicable: Secondary | ICD-10-CM | POA: Diagnosis not present

## 2018-01-28 MED ORDER — CEPHALEXIN 500 MG PO CAPS: 500.0000 mg | ORAL_CAPSULE | Freq: Four times a day (QID) | ORAL | 0 refills | Status: AC

## 2018-01-28 MED ORDER — LIDOCAINE HCL (PF) 1 % IJ SOLN
5.0000 mL | Freq: Once | INTRAMUSCULAR | Status: AC
  Administered 2018-01-28: 5 mL
  Filled 2018-01-28: qty 5

## 2018-01-28 MED ORDER — BACITRACIN ZINC 500 UNIT/GM EX OINT
TOPICAL_OINTMENT | Freq: Two times a day (BID) | CUTANEOUS | Status: DC
  Administered 2018-01-28: 1 via TOPICAL
  Filled 2018-01-28: qty 0.9

## 2018-01-28 MED ORDER — ACETAMINOPHEN 325 MG PO TABS
650.0000 mg | ORAL_TABLET | Freq: Once | ORAL | Status: AC
  Administered 2018-01-28: 650 mg via ORAL
  Filled 2018-01-28: qty 2

## 2018-01-28 NOTE — Discharge Instructions (Addendum)
Please return to the Emergency Department for any new or worsening symptoms or if your symptoms do not improve. Please be sure to follow up with your Primary Care Physician as soon as possible regarding your visit today. If you do not have a Primary Doctor please use the resources below to establish one. You must follow-up in 5 days to have your sutures rechecked and removed. Please take the antibiotic Keflex as prescribed for infection prophylaxis.  Contact a health care provider if: You received a tetanus shot and you have swelling, severe pain, redness, or bleeding at the injection site. You have a fever. A wound that was closed breaks open. You notice a bad smell coming from the wound. You notice something coming out of the wound, such as wood or glass. Your pain is not controlled with medicine. You have increased redness, swelling, or pain at the site of your wound. You have fluid, blood, or pus coming from your wound. You notice a change in the color of your skin near your wound. You need to change the dressing frequently due to fluid, blood, or pus draining from the wound. You develop a new rash. You develop numbness around the wound. Get help right away if: You develop severe swelling around the injury site. Your pain suddenly increases and is severe. You develop painful lumps near the wound or on skin that is anywhere on your body. You have a red streak going away from your wound. The wound is on your hand or foot and you cannot properly move a finger or toe. The wound is on your hand or foot and you notice that your fingers or toes look pale or bluish. Get help right away if: You have: A very bad (severe) headache that is not helped by medicine. Trouble walking or weakness in your arms and legs. Clear or bloody fluid coming from your nose or ears. Changes in your seeing (vision). Jerky movements that you cannot control (seizure). You throw up (vomit). Your symptoms get  worse. You lose balance. Your speech is slurred. You pass out. You are sleepier and have trouble staying awake. The black centers of your eyes (pupils) change in size.  Do not take your medicine if  develop an itchy rash, swelling in your mouth or lips, or difficulty breathing.   RESOURCE GUIDE  Chronic Pain Problems: Contact [facility]  [phone] Patients need to be referred by their primary care doctor.  Insufficient Money for Medicine: Contact United Way:  call "211" or [facility] (754)247-5275.  No Primary Care Doctor: Call [facility]  [phone] - can help you locate a primary care doctor that  accepts your insurance, provides certain services, etc. Physician Referral [phone]  Agencies that provide inexpensive medical care: [facility]  [phone] [facility]  [phone] [facility]  [phone] [facility]  [phone] Planned Parenthood  [phone] [facility]  [phone]  Medicaid-accepting [facility] Providers: [facility]- [address]  [phone], Mon-Fri 9am-7pm, Sat 9am-1pm [facility]- [address] [place], Suite [place]  [phone] [facility]- [address], [address] [place]  [phone] [facility]- [address]  [phone] Renaye Rakers- [address] [place], [address], [phone]  Only accepts [place] patients after they have their name  applied to their card  Self Pay (no insurance) in [facility]: Sickle Cell  Patients: Dr Willey Blade, [facility] Internal Medicine  [address] [place], [phone] [facility] Urgent Care- [address] [place]  [phone]       Patrcia Dolly [facility]gent Care [place]- [address]       -     [facility]- see information above (Speak to [employer] if you do not have insurance)       -  [facility]- [address]  [place], [phone]       -  [facility] [place]- [address],  [phone]       -  [facility]- [address], [phone]       -  Dr Julio Sicks-  [address], [address], [place], [phone]       -  [facility]- [address] [phone]       -  [facility]- [address] [phone], also [address] [phone]       -    [facility]- [address] [place], [phone], 1st & 3rd Saturday   every month, 10am-1pm  1) Find a Doctor and Pay Out of Pocket Although you won't have to find out who is covered by your insurance plan, it is a good idea to ask around and get recommendations. You will then need to call the office and see if the doctor you have chosen will accept you as a new patient and what types of options they offer for patients who are self-pay. Some doctors offer discounts or will set up payment plans for their patients who do not have insurance, but you will need to ask so you aren't surprised when you get to your appointment.  2) Contact Your Local Health Department Not all health departments have doctors that can see patients for sick visits, but many do, so it is worth a call to see if yours does. If you don't know where your local health department is, you can check in your phone book. The CDC also has a tool to help you locate your state's health department, and many state websites also have listings of all of their local health departments.  3) Find a Walk-in Clinic If your illness is not likely to be very severe or complicated, you may want to try a walk in clinic. These are popping up all over the country in pharmacies, drugstores, and shopping centers. They're usually staffed by nurse practitioners or physician assistants that have been trained to treat common illnesses and complaints. They're usually fairly quick and inexpensive. However, if you have serious medical issues or chronic medical problems, these  are probably not your best option  STD Testing [facility] Department of [facility] [place], STD Clinic, [address] [place], phone [phone] or [phone].  Monday - Friday, call for an appointment. [facility] Department of [employer], STD Clinic, [address], [place], phone [phone] or [phone].  Monday - Friday, call for an appointment.  Abuse/Neglect: [facility] Child Abuse Hotline 682-436-9167 [facility] Child Abuse Hotline 941-274-2806 (After Hours)  Emergency Shelter:  [facility] (250)637-2611  Maternity Homes: Room at the [place] [facility] 3026276635 [facility] [phone]  MRSA Hotline #:   727-349-3653  [facility] Resources  [facility] [place]  United Way [facility] Dept. [address]                 [address]         [address] [place] [address]  Blondell Reveal Phone:  [phone]                                  Phone:  [phone]                   Phone:  [phone]  [facility], [phone] [facility] [place]- [phone]       -     [facility] in [place], [address]                                  [phone], [facility] Child Abuse Hotline (980)282-6105 or (713)160-1210 (After Hours)   Behavioral Health Services  Substance Abuse Resources: [facility] Drug Services  [phone] [facility] [phone] [facility] [place] [phone] Floydene Flock 7164433324 Residential & Outpatient Substance Abuse Program  4190759139  Psychological Services: [facility]  [phone] [facility]  [phone] [facility], [phone] [place]. [address] [place], ACCESS LINE: [phone] or  [phone], [employer].za  Dental Assistance  If unable to pay or uninsured, contact:  [facility] or [facility]. to become qualified for the adult dental clinic.  Patients with Medicaid: [facility] [phone] [address], [phone] [address] [phone]  If unable to pay, or uninsured, contact [facility] [phone]) or [facility] Department [phone] in [place], [phone] in [facility]) to become qualified for the adult dental clinic   Other Low-Cost Community Dental Services: [facility]- [address] [place], [place], [place], [address], [phone], Ext. [phone], 2nd and 4th Thursday of the month at 6:30am.  10 clients each day by appointment, can sometimes see walk-in patients if someone does not show for an appointment. [facility]- [address] [place] Griffins [place], [place], [address], [phone] [facility] [address] [place], [place], [address], [phone] [facility] Health Department- [phone] [facility] Health Department- [phone] [facility] [phone]
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
## 2018-01-28 NOTE — ED Notes (Signed)
Pt to CT at this time.
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
## 2018-02-03 ENCOUNTER — Ambulatory Visit (HOSPITAL_COMMUNITY)
Admission: EM | Admit: 2018-02-03 | Discharge: 2018-02-03 | Disposition: A | Discharge status: Home / Self Care | Payer: Medicaid Other

## 2018-02-03 DIAGNOSIS — Z4802 Encounter for removal of sutures: Secondary | ICD-10-CM | POA: Diagnosis not present

## 2018-02-03 DIAGNOSIS — S0181XA Laceration without foreign body of other part of head, initial encounter: Secondary | ICD-10-CM

## 2018-02-03 NOTE — ED Notes (Signed)
Pt presented to have 9 sutures removed from left side of forehead

## 2018-11-20 ENCOUNTER — Other Ambulatory Visit: Payer: Self-pay

## 2018-11-20 ENCOUNTER — Inpatient Hospital Stay (HOSPITAL_COMMUNITY)
Admission: AD | Admit: 2018-11-20 | Discharge: 2018-11-20 | Disposition: A | Discharge status: Home / Self Care | Payer: Medicaid Other | Attending: Obstetrics and Gynecology | Admitting: Obstetrics and Gynecology

## 2018-11-20 ENCOUNTER — Encounter (HOSPITAL_COMMUNITY): Payer: Self-pay | Admitting: *Deleted

## 2018-11-20 DIAGNOSIS — O034 Incomplete spontaneous abortion without complication: Secondary | ICD-10-CM | POA: Insufficient documentation

## 2018-11-20 DIAGNOSIS — O26891 Other specified pregnancy related conditions, first trimester: Secondary | ICD-10-CM | POA: Insufficient documentation

## 2018-11-20 DIAGNOSIS — Z3201 Encounter for pregnancy test, result positive: Secondary | ICD-10-CM

## 2018-11-20 LAB — POCT PREGNANCY, URINE: Preg Test, Ur: POSITIVE — AB

## 2018-11-20 NOTE — Discharge Instructions (Signed)
Home Pregnancy Test Information ° °A home pregnancy test helps you determine whether you are pregnant or not. There are several types of home pregnancy tests that can be bought at a grocery store or pharmacy. °What is being tested? °A home pregnancy test detects the presence of a hormone in your urine. The hormone is produced by cells of the placenta (human chorionic gonadotropin, or hCG). The placenta is the organ that forms to nourish and support a developing baby. °How are pregnancy tests done? °Home pregnancy tests require a urine sample. °· Most kits use a plastic testing device with a strip of paper that indicates whether there is hCG in your urine. °· Follow the test package instructions very carefully for how to test your urine. Depending on the test, you may need to: °? Urinate directly onto the stick. °? Urinate into a cup. °· Wait for the results as directed by the package instructions. The amount of time may be different for each type of test. °· Follow the test package instructions for how to read your test results. Depending on the test, results may be displayed as: °? A plus or a minus sign. °? One or two lines. °? "Pregnant" or "not pregnant." °· For best results, use your first urine of the morning. That is when the concentration of hCG is highest. °How accurate are home pregnancy tests? °Home pregnancy tests are very accurate when: °· You are at least 3-[redacted] weeks pregnant. °· It has been 1-2 weeks since your missed period. °· You use the test according to the package instructions. °What can interfere with home pregnancy test results? °Sometimes, a home pregnancy test may report that you are pregnant when you are not pregnant (false-positive result). This can happen if you: °· Are taking certain medicines, such as: °? Medicine to control seizures. °? Anti-anxiety medicine. °? Fertility medicine with hCG. °· Have a medical condition that affects your hormone levels. °· Had a recent pregnancy loss  (miscarriage) or abortion. °Sometimes, a home pregnancy test may report that you are not pregnant when you are pregnant (false-negative result). This can happen if you: °· Took the test too early in your pregnancy. Before 3-4 weeks of pregnancy, there may not be enough hCG to detect. °· Drank a lot of liquid before the test. °· Used an expired pregnancy test. °· Are taking certain medicines, such as antihistamines or water pills (diuretics). °What should I do if I have a positive pregnancy test? °If you have a positive home pregnancy test, schedule an appointment with your health care provider. You might need additional testing to confirm the pregnancy. °What should I do if I have a negative pregnancy test? °If you have a negative home pregnancy test but still have symptoms of pregnancy, contact your health care provider. Your health care provider will test a sample of your blood to check for pregnancy. In some cases, a blood test will return a positive result even if a urine test was negative because blood tests are more sensitive. This means blood tests can detect hCG earlier than home pregnancy tests. °Follow these instructions at home: °If you are pregnant, planning to become pregnant, or think you may be pregnant: °· Do not drink alcohol. °· Do not use street drugs. °· Do not use any products that contain nicotine or tobacco, such as cigarettes and e-cigarettes. If you need help quitting, ask your health care provider. °· Take a prenatal vitamin that contains at least 400 mcg of folic   acid daily. °Summary °· A home pregnancy test helps you determine whether you are pregnant or not by detecting the presence of the hormone human chorionic gonadotropin (hCG) in a sample of your urine. °· Follow the test package instructions very carefully. For best results, use your first urine of the morning. That is when the concentration of hCG is highest. °· Home pregnancy tests are very accurate when you are 3-[redacted] weeks  pregnant or when it has been 1-2 weeks since your missed period. °· A home pregnancy test may report that you are pregnant when you are not pregnant or that you are not pregnant when you are pregnant. °· Contact your health care provider to confirm your results. Your health care provider will test a sample of your blood to check for pregnancy. °This information is not intended to replace advice given to you by your health care provider. Make sure you discuss any questions you have with your health care provider. °Document Released: 03/25/2003 Document Revised: 07/13/2018 Document Reviewed: 04/04/2017 °Elsevier Patient Education © 2020 [employer] ° °

## 2018-11-20 NOTE — MAU Note (Signed)
Pt had an abortion last month. Took "pills on July 24 &25. Had bleeding and passed clots for about 4 days. Has not had a period since. Has been having intercourse. Took 3 HPt this week and all were positive. Not sure if it is a new pregnancy or not. Denies any vag bleeding or pain at this time.

## 2018-11-20 NOTE — MAU Provider Note (Signed)
First Provider Initiated Contact with Patient 11/20/18 2253     S [name] is a 33 y.o. [id number] pregnant female who presents to MAU today for confirmation. She reports having a TAB on 7/24 at [facility] (abortion pills x2). Bled for 1 week. Then had unprotected IC after she stopped bleeding. She reports taking 2 HPT d/t having some nausea over the weekend, both test positive. She is unsure if this is a new pregnancy or old pregnancy and presents to MAU to be evaluated. She denies abdominal pain or bleeding. Reports having cramping last week but none today.  O BP (!) 142/83   Pulse (!) 106   Temp 98.7 F (37.1 C)   Resp 18   Ht 5' (1.524 m)   Wt 119.7 kg   LMP 09/06/2018 Comment: too abortion pills on July 24  BMI 51.56 kg/m  Physical Exam  Nursing note and vitals reviewed. Constitutional: She is oriented to person, place, and time. She appears well-developed and well-nourished. No distress.  Cardiovascular: Normal rate and regular rhythm.  Respiratory: Effort normal and breath sounds normal. No respiratory distress. She has no wheezes.  GI: Soft. She exhibits no distension. There is no abdominal tenderness. There is no rebound.  Musculoskeletal: Normal range of motion.        General: No edema.  Neurological: She is alert and oriented to person, place, and time.  Psychiatric: She has a normal mood and affect. Her behavior is normal. Thought content normal.   A Early pregnant female vs incomplete abortion  Medical screening exam complete +UPT  P Discharge from MAU in stable condition Patient instructed to call office in the morning to schedule appointment  Warning signs for worsening condition that would warrant emergency follow-up discussed Patient to return to MAU as needed for vaginal bleeding, abdominal cramping, or other pregnancy related concerns.   Lajean Manes, CNM 11/20/2018 11:04 PM

## 2019-03-07 ENCOUNTER — Other Ambulatory Visit: Payer: Self-pay

## 2019-03-07 ENCOUNTER — Encounter (HOSPITAL_COMMUNITY): Payer: Self-pay | Admitting: Emergency Medicine

## 2019-03-07 ENCOUNTER — Ambulatory Visit (INDEPENDENT_AMBULATORY_CARE_PROVIDER_SITE_OTHER): Payer: Medicaid Other

## 2019-03-07 ENCOUNTER — Ambulatory Visit (HOSPITAL_COMMUNITY)
Admission: EM | Admit: 2019-03-07 | Discharge: 2019-03-07 | Disposition: A | Discharge status: Home / Self Care | Payer: Medicaid Other | Attending: Urgent Care | Admitting: Urgent Care

## 2019-03-07 DIAGNOSIS — M25562 Pain in left knee: Secondary | ICD-10-CM

## 2019-03-07 DIAGNOSIS — M25461 Effusion, right knee: Secondary | ICD-10-CM

## 2019-03-07 DIAGNOSIS — M25561 Pain in right knee: Secondary | ICD-10-CM

## 2019-03-07 DIAGNOSIS — S8001XA Contusion of right knee, initial encounter: Secondary | ICD-10-CM

## 2019-03-07 DIAGNOSIS — W19XXXA Unspecified fall, initial encounter: Secondary | ICD-10-CM

## 2019-03-07 MED ORDER — NAPROXEN 500 MG PO TABS: 500.0000 mg | ORAL_TABLET | Freq: Two times a day (BID) | ORAL | 0 refills | Status: DC
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
## 2019-03-07 NOTE — Discharge Instructions (Addendum)
I highly recommend resting off of your knee and avoiding doing a lot of bending, weight bearing for at least the next 2-3 days. You can use an Ace wrap if you are going to be active. Ice your knee for 20 minutes at the end of your day until your knee pain resolves.
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
## 2021-04-27 ENCOUNTER — Ambulatory Visit (HOSPITAL_COMMUNITY)
Admission: EM | Admit: 2021-04-27 | Discharge: 2021-04-27 | Disposition: A | Discharge status: Home / Self Care | Payer: Medicaid Other | Attending: Sports Medicine | Admitting: Sports Medicine

## 2021-04-27 ENCOUNTER — Other Ambulatory Visit: Payer: Self-pay

## 2021-04-27 ENCOUNTER — Ambulatory Visit (INDEPENDENT_AMBULATORY_CARE_PROVIDER_SITE_OTHER): Payer: Medicaid Other

## 2021-04-27 ENCOUNTER — Encounter (HOSPITAL_COMMUNITY): Payer: Self-pay

## 2021-04-27 DIAGNOSIS — M25572 Pain in left ankle and joints of left foot: Secondary | ICD-10-CM

## 2021-04-27 DIAGNOSIS — S93401A Sprain of unspecified ligament of right ankle, initial encounter: Secondary | ICD-10-CM

## 2021-04-27 NOTE — Discharge Instructions (Signed)
Ice the ankle for 20 minutes at least 3 times daily May use ibuprofen or Tylenol for pain control Remain in the ankle brace and crutches, you may come out of the crutches as her pain allows If your pain is not feeling better within a week, follow-up with sports medicine or orthopedics Rest, ice, elevate the ankle

## 2021-04-27 NOTE — ED Triage Notes (Signed)
Pt states fell Saturday night and c/o lt ankle pain, swelling, and bruising. Denies taking anything. States unable to bare weight.

## 2021-04-27 NOTE — ED Provider Notes (Signed)
[facility]    CSN: [id number] Arrival date & time: 04/27/21  1314      History   Chief Complaint No chief complaint on file.   HPI Amy Delgado is a 36 y.o. female who presents with left ankle and foot pain.  HPI  Larey SeatFell in shower (was intoxicated) very early Sunday morning 0500, was able to walk at that time but had immediate pain. She woke up that previous morning and tried to put her foot down to walk and was unable to.  Had significant pain and swelling on the anterolateral aspect of the ankle and foot No bruising or erythema  Hopping on one foot, hasn't been able to weight-bear since No hx of left ankle injury/sprain Icing it few times a day   Meds: nothing  Past Medical History:  Diagnosis Date   Anemia    Anxiety    Obesity    Vaginal Pap smear, abnormal     Patient Active Problem List   Diagnosis Date Noted   Indication for care in labor and delivery, antepartum 12/18/2017   NSVD (normal spontaneous vaginal delivery) 12/18/2017   Active labor 09/21/2014   Pregnancy 09/21/2014   [redacted] weeks gestation of pregnancy    Antepartum variable deceleration    Non-reactive NST (non-stress test)     Past Surgical History:  Procedure Laterality Date   surgery on lip  1994   stabbed with screwdriver    OB History     Gravida  10   Para  5   Term  2   Preterm  3   AB  4   Living  5      SAB  0   IAB  1   Ectopic  0   Multiple  0   Live Births  3            Home Medications    Prior to Admission medications   Medication Sig Start Date End Date Taking? Authorizing Provider  naproxen (NAPROSYN) 500 MG tablet Take 1 tablet (500 mg total) by mouth 2 (two) times daily. 03/07/19   Wallis BambergMani, Mario, PA-C    Family History Family History  Problem Relation Age of Onset   Diabetes Father     Social History Social History   Tobacco Use   Smoking status: Never   Smokeless tobacco: Never  Substance Use Topics   Alcohol use: Yes     Comment: occasional   Drug use: No     Allergies   Patient has no known allergies.   Review of Systems Review of Systems  Constitutional:  Negative for chills and fever.  Musculoskeletal:  Positive for arthralgias, gait problem and joint swelling (left ankle/foot).  Skin:  Negative for pallor.  Hematological:  Does not bruise/bleed easily.    Physical Exam Triage Vital Signs ED Triage Vitals [04/27/21 1351]  Enc Vitals Group     BP 113/79     Pulse Rate 83     Resp 18     Temp 98.5 F (36.9 C)     Temp Source Oral     SpO2 97 %     Weight      Height      Head Circumference      Peak Flow      Pain Score 4     Pain Loc      Pain Edu?      Excl. in GC?    No data found.  Updated Vital Signs BP 113/79 (BP Location: Left Arm)    Pulse 83    Temp 98.5 F (36.9 C) (Oral)    Resp 18    LMP 04/07/2021    SpO2 97%    Breastfeeding No   Physical Exam Gen: Well-appearing, in no acute distress; non-toxic CV: Regular Rate. Well-perfused. Warm.  Resp: Breathing unlabored on room air; no wheezing. Psych: Fluid speech in conversation; appropriate affect; normal thought process Neuro: Sensation intact throughout. No gross coordination deficits.  MSK:  - Left ankle/foot: + TTP over the distal tip of the lateral malleolus, over the ATFL region, over the base of the fifth metatarsal.  Inspection yields significant soft tissue swelling, trivial ecchymosis, no bony deformity noted.  Patient is able to wiggle all 5 toes without difficulty.  Restricted range of motion secondary to pain. + Anterior drawer sign with notable laxity present.  Unable to walk 4 steps in the urgent care.  Negative tib-fib squeeze test.  Neurovascular intact distally.   UC Treatments / Results  Labs (all labs ordered are listed, but only abnormal results are displayed) Labs Reviewed - No data to display  EKG   Radiology DG Ankle Complete Left  Result Date: 04/27/2021 CLINICAL DATA:  Ankle  inversion injury yesterday, ankle pain EXAM: LEFT ANKLE COMPLETE - 3+ VIEW COMPARISON:  None. FINDINGS: Mild soft tissue swelling overlying the malleoli. No underlying fracture is observed. Talar dome appears intact. No malalignment. Suspected anterior tibiotalar joint effusion. IMPRESSION: 1. Suspected anterior tibiotalar joint effusion. 2. Mild soft tissue swelling over the malleoli. 3. No fracture identified. Electronically Signed   By: Gaylyn Rong M.D.   On: 04/27/2021 14:25   DG Foot Complete Left  Result Date: 04/27/2021 CLINICAL DATA:  Ankle inversion injury yesterday morning. Lateral foot and ankle pain. EXAM: LEFT FOOT - COMPLETE 3+ VIEW COMPARISON:  None FINDINGS: Suspected tibiotalar joint effusion. No fracture or malalignment. No foreign body observed. IMPRESSION: 1. No acute bony findings. 2. Possible tibiotalar joint effusion. Electronically Signed   By: Gaylyn Rong M.D.   On: 04/27/2021 14:24    Procedures Procedures (including critical care time)  Medications Ordered in UC Medications - No data to display  Initial Impression / Assessment and Plan / UC Course  I have reviewed the triage vital signs and the nursing notes.  Pertinent labs & imaging results that were available during my care of the patient were reviewed by me and considered in my medical decision making (see chart for details).     Left foot and ankle pain s/p fall on 04/26/21  Patient with anterolateral ankle and foot pain and swelling after fall on 04/26/2021.  X-rays do not demonstrate any evidence of acute fracture, there is soft tissue in a likely joint effusion noted.  Patient still having difficulty with weightbearing, we will transition her into an ASO lace up ankle brace and provide crutches that she may wean off as her pain allows.  Discussed rest, activity modification and NSAID therapy as needed.  We will provide information for local sports medicine orthopedic office for her to follow-up in  about 1 week if her symptoms or not improving.  Will discharge with ankle rehab exercises for her to perform as her pain improves.  Return precautions provided.  Final Clinical Impressions(s) / UC Diagnoses   Final diagnoses:  Sprain of right ankle, unspecified ligament, initial encounter  Pain of joint of left ankle and foot     Discharge Instructions  Ice the ankle for 20 minutes at least 3 times daily May use ibuprofen or Tylenol for pain control Remain in the ankle brace and crutches, you may come out of the crutches as her pain allows If your pain is not feeling better within a week, follow-up with sports medicine or orthopedics Rest, ice, elevate the ankle      ED Prescriptions   None    PDMP not reviewed this encounter.   Madelyn Brunner, DO 04/27/21 1441

## 2022-04-14 ENCOUNTER — Ambulatory Visit
Admission: EM | Admit: 2022-04-14 | Discharge: 2022-04-14 | Disposition: A | Discharge status: Home / Self Care | Payer: Medicaid Other | Attending: Urgent Care | Admitting: Urgent Care

## 2022-04-14 DIAGNOSIS — B0089 Other herpesviral infection: Secondary | ICD-10-CM

## 2022-04-14 MED ORDER — VALACYCLOVIR HCL 1 G PO TABS
1000.0000 mg | ORAL_TABLET | Freq: Three times a day (TID) | ORAL | 0 refills | Status: AC
Start: 2022-04-14 — End: ?

## 2022-04-14 NOTE — ED Triage Notes (Signed)
Pt c/o rash to left buttock x 2-3 days-NAD-steady gait

## 2022-04-14 NOTE — ED Provider Notes (Signed)
  [facility]  Note:  This document was prepared using Systems analyst and may include unintentional dictation errors.  MRN: [id number] DOB: 1985-11-30  Subjective:   Amy Delgado is a 37 y.o. female presenting for 2-3 day history of painful blister lesions over the left buttock.  Patient reports that she has previously had blood test done that showed she had herpes simplex virus.  Has never had a breakout.  Does not want testing.  No current facility-administered medications for this encounter.  Current Outpatient Medications:    naproxen (NAPROSYN) 500 MG tablet, Take 1 tablet (500 mg total) by mouth 2 (two) times daily., Disp: 30 tablet, Rfl: 0   No Known Allergies  Past Medical History:  Diagnosis Date   Anemia    Anxiety    Obesity    Vaginal Pap smear, abnormal      Past Surgical History:  Procedure Laterality Date   surgery on lip  1994   stabbed with screwdriver    Family History  Problem Relation Age of Onset   Diabetes Father     Social History   Tobacco Use   Smoking status: Never   Smokeless tobacco: Never  Vaping Use   Vaping Use: Never used  Substance Use Topics   Alcohol use: Yes    Comment: occasional   Drug use: No    ROS   Objective:   Vitals: BP (!) 147/80 (BP Location: Right Arm)   Pulse 100   Temp 99.9 F (37.7 C) (Oral)   Resp 15   LMP 04/11/2022   SpO2 98%   Physical Exam Constitutional:      General: She is not in acute distress.    Appearance: Normal appearance. She is well-developed. She is not ill-appearing, toxic-appearing or diaphoretic.  HENT:     Head: Normocephalic and atraumatic.     Nose: Nose normal.     Mouth/Throat:     Mouth: Mucous membranes are moist.  Eyes:     General: No scleral icterus.       Right eye: No discharge.        Left eye: No discharge.     Extraocular Movements: Extraocular movements intact.  Cardiovascular:     Rate and Rhythm: Normal  rate.  Pulmonary:     Effort: Pulmonary effort is normal.  Skin:    General: Skin is warm and dry.       Neurological:     General: No focal deficit present.     Mental Status: She is alert and oriented to person, place, and time.  Psychiatric:        Mood and Affect: Mood normal.        Behavior: Behavior normal.     Assessment and Plan :   PDMP not reviewed this encounter.  1. 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  Will be using valacyclovir 3 times daily for 10 days.  I offered future refills but she states she will see her PCP for this. Counseled patient on potential for adverse effects with medications prescribed/recommended today, ER and return-to-clinic precautions discussed, patient verbalized understanding.    Jaynee Eagles, PA-C 04/14/22 1755

## 2022-04-14 NOTE — Discharge Instructions (Signed)
Start valacyclovir to address your herpes simplex. Take this 3 times daily for 10 days. Follow up with your PCP if you need refills of this medication for future outbreaks.

## 2023-05-18 ENCOUNTER — Ambulatory Visit
Admission: EM | Admit: 2023-05-18 | Discharge: 2023-05-18 | Disposition: A | Discharge status: Home / Self Care | Payer: Medicaid Other | Attending: Physician Assistant | Admitting: Physician Assistant

## 2023-05-18 DIAGNOSIS — Z202 Contact with and (suspected) exposure to infections with a predominantly sexual mode of transmission: Secondary | ICD-10-CM | POA: Insufficient documentation

## 2023-05-18 DIAGNOSIS — Z113 Encounter for screening for infections with a predominantly sexual mode of transmission: Secondary | ICD-10-CM | POA: Diagnosis present

## 2023-05-18 NOTE — ED Provider Notes (Signed)
[name]    CSN: [id number] Arrival date & time: 05/18/23  1705      History   Chief Complaint Chief Complaint  Patient presents with   Exposure to STD    HPI Amy Delgado is a 38 y.o. female.   Patient presents today requesting STI testing.  She reports potential exposure as her ex with whom she had unprotected sex was told by previous partner that he was potentially exposed to gonorrhea and trichomonas.  Supposedly, he is not having any symptoms and tested negative but patient wants to be tested to be sure that she does not have something.  She is not experiencing any symptoms and denies any pelvic pain, abdominal pain, fever, abnormal discharge, vaginal odor.  She has been treated with metronidazole for trichomonas within the past 3 months but denies additional antibiotic use.  She is not interested in HIV, hepatitis, syphilis testing as she typically does this with her primary care.  She has no concern for pregnancy.    Past Medical History:  Diagnosis Date   Anemia    Anxiety    Obesity    Vaginal Pap smear, abnormal     Patient Active Problem List   Diagnosis Date Noted   Indication for care in labor and delivery, antepartum 12/18/2017   NSVD (normal spontaneous vaginal delivery) 12/18/2017   Active labor 09/21/2014   Pregnancy 09/21/2014   [redacted] weeks gestation of pregnancy    Antepartum variable deceleration    Non-reactive NST (non-stress test)     Past Surgical History:  Procedure Laterality Date   surgery on lip  1994   stabbed with screwdriver    OB History     Gravida  10   Para  5   Term  2   Preterm  3   AB  4   Living  5      SAB  0   IAB  1   Ectopic  0   Multiple  0   Live Births  3            Home Medications    Prior to Admission medications   Medication Sig Start Date End Date Taking? Authorizing Provider  naproxen (NAPROSYN) 500 MG tablet Take 1 tablet (500 mg total) by mouth 2 (two) times  daily. 03/07/19   Wallis Bamberg, PA-C  valACYclovir (VALTREX) 1000 MG tablet Take 1 tablet (1,000 mg total) by mouth 3 (three) times daily. 04/14/22   Wallis Bamberg, PA-C    Family History Family History  Problem Relation Age of Onset   Diabetes Father     Social History Social History   Tobacco Use   Smoking status: Never   Smokeless tobacco: Never  Vaping Use   Vaping status: Never Used  Substance Use Topics   Alcohol use: Yes    Comment: occasional   Drug use: No     Allergies   Patient has no known allergies.   Review of Systems Review of Systems  Constitutional:  Negative for activity change, appetite change, fatigue and fever.  Gastrointestinal:  Negative for abdominal pain, diarrhea, nausea and vomiting.  Genitourinary:  Negative for dysuria, frequency, pelvic pain, urgency, vaginal bleeding, vaginal discharge and vaginal pain.     Physical Exam Triage Vital Signs ED Triage Vitals  Encounter Vitals Group     BP 05/18/23 1757 137/77     Systolic BP Percentile --      Diastolic BP Percentile --  Pulse Rate 05/18/23 1757 96     Resp 05/18/23 1757 18     Temp 05/18/23 1757 99 F (37.2 C)     Temp Source 05/18/23 1757 Oral     SpO2 05/18/23 1757 96 %     Weight --      Height --      Head Circumference --      Peak Flow --      Pain Score 05/18/23 1756 0     Pain Loc --      Pain Education --      Exclude from Growth Chart --    No data found.  Updated Vital Signs BP 137/77 (BP Location: Right Arm)   Pulse 96   Temp 99 F (37.2 C) (Oral)   Resp 18   LMP 05/09/2023 (Exact Date)   SpO2 96%   Visual Acuity Right Eye Distance:   Left Eye Distance:   Bilateral Distance:    Right Eye Near:   Left Eye Near:    Bilateral Near:     Physical Exam Vitals reviewed.  Constitutional:      General: She is awake. She is not in acute distress.    Appearance: Normal appearance. She is well-developed. She is not ill-appearing.     Comments: Very  pleasant female appears stated age in no acute distress sitting comfortably in exam room  HENT:     Head: Normocephalic and atraumatic.  Cardiovascular:     Rate and Rhythm: Normal rate and regular rhythm.     Heart sounds: Normal heart sounds, S1 normal and S2 normal. No murmur heard. Pulmonary:     Effort: Pulmonary effort is normal.     Breath sounds: Normal breath sounds. No wheezing, rhonchi or rales.     Comments: Clear to auscultation bilaterally Abdominal:     Palpations: Abdomen is soft.     Tenderness: There is no abdominal tenderness. There is no right CVA tenderness, left CVA tenderness, guarding or rebound.  Genitourinary:    Comments: Exam deferred Psychiatric:        Behavior: Behavior is cooperative.      UC Treatments / Results  Labs (all labs ordered are listed, but only abnormal results are displayed) Labs Reviewed  CERVICOVAGINAL ANCILLARY ONLY    EKG   Radiology No results found.  Procedures Procedures (including critical care time)  Medications Ordered in UC Medications - No data to display  Initial Impression / Assessment and Plan / UC Course  I have reviewed the triage vital signs and the nursing notes.  Pertinent labs & imaging results that were available during my care of the patient were reviewed by me and considered in my medical decision making (see chart for details).     Patient is well-appearing, afebrile, nontoxic, nontachycardic.  Vital signs and physical exam are reassuring with no indication for emergent evaluation or imaging.  She declined HIV, hepatitis, syphilis testing.  STI swab was collected and is pending.  She denies any specific exposure and has no symptoms will defer treatment.  Discussed that she is to abstain from sex until she receives her results and discussed the importance of safe sex practices.  If she develops any symptoms she is to return for reevaluation.  Final Clinical Impressions(s) / UC Diagnoses   Final  diagnoses:  Screening examination for STI  Possible exposure to STI     Discharge Instructions      We will contact you if any intervention  and treatment based on your swab results.  Use condoms take sexual encounter and I recommend avoiding sex until you receive your results.  If you develop any symptoms including abdominal pain, pelvic pain, abnormal discharge she should be seen again so we can reevaluate you and consider additional testing.    ED Prescriptions   None    PDMP not reviewed this encounter.   Jeani Hawking, PA-C 05/18/23 1909

## 2023-05-18 NOTE — ED Triage Notes (Signed)
Pt states she has no symptoms but one of her previous partners has contracted an STD and made her aware so she would like to be tested.

## 2023-05-18 NOTE — Discharge Instructions (Signed)
We will contact you if any intervention and treatment based on your swab results.  Use condoms take sexual encounter and I recommend avoiding sex until you receive your results.  If you develop any symptoms including abdominal pain, pelvic pain, abnormal discharge she should be seen again so we can reevaluate you and consider additional testing.

## 2023-05-19 LAB — CERVICOVAGINAL ANCILLARY ONLY
Chlamydia: NEGATIVE
Comment: NEGATIVE
Comment: NEGATIVE
Comment: NORMAL
Neisseria Gonorrhea: NEGATIVE
Trichomonas: NEGATIVE

## 2023-06-12 ENCOUNTER — Inpatient Hospital Stay (HOSPITAL_COMMUNITY)

## 2023-06-12 ENCOUNTER — Inpatient Hospital Stay (HOSPITAL_COMMUNITY)
Admission: AD | Admit: 2023-06-12 | Discharge: 2023-06-12 | Disposition: A | Discharge status: Home / Self Care | Attending: Obstetrics and Gynecology | Admitting: Obstetrics and Gynecology

## 2023-06-12 ENCOUNTER — Other Ambulatory Visit: Payer: Self-pay

## 2023-06-12 ENCOUNTER — Encounter (HOSPITAL_COMMUNITY): Payer: Self-pay | Admitting: Obstetrics and Gynecology

## 2023-06-12 DIAGNOSIS — O3680X0 Pregnancy with inconclusive fetal viability, not applicable or unspecified: Secondary | ICD-10-CM | POA: Diagnosis not present

## 2023-06-12 DIAGNOSIS — O209 Hemorrhage in early pregnancy, unspecified: Secondary | ICD-10-CM | POA: Insufficient documentation

## 2023-06-12 DIAGNOSIS — Z3A01 Less than 8 weeks gestation of pregnancy: Secondary | ICD-10-CM | POA: Diagnosis not present

## 2023-06-12 DIAGNOSIS — O26891 Other specified pregnancy related conditions, first trimester: Secondary | ICD-10-CM | POA: Diagnosis not present

## 2023-06-12 DIAGNOSIS — R1030 Lower abdominal pain, unspecified: Secondary | ICD-10-CM | POA: Insufficient documentation

## 2023-06-12 LAB — POCT PREGNANCY, URINE: Preg Test, Ur: POSITIVE — AB

## 2023-06-12 LAB — HCG, QUANTITATIVE, PREGNANCY: hCG, Beta Chain, Quant, S: 28 m[IU]/mL — ABNORMAL HIGH (ref <5)

## 2023-06-12 NOTE — MAU Note (Signed)
 Amy Delgado is a 38 y.o. at Unknown here in MAU reporting: Patient reports that she saw wiped and saw brown on the tissue and then she saw red. Patient reports she has been seen for this pregnancy on Wednesday and her HCG was 47 last night she was seen and it was 25.  LMP: 05/06/2023 Onset of complaint:  Pain score: 3 lower abdominal cramping  There were no vitals filed for this visit.    Lab orders placed from triage:  ua

## 2023-06-12 NOTE — MAU Provider Note (Signed)
 Chief Complaint: Vaginal Bleeding  SUBJECTIVE HPI: Amy Delgado is a 38 y.o. [id number] at [redacted]w[redacted]d by LMP 05/06/2023. who presents to maternity admissions reporting brown on the tissue and then she saw red. Patient reports she was seen for this pregnancy on Wednesday and her HCG was 47, and last night she was seen and it was 25(records not available). Patient reports she has not had an ultrasound this pregnancy. Associated 3 lower abdominal cramping .  She denies vaginal itching/burning, urinary symptoms, h/a, dizziness, n/v, or fever/chills.    HPI  Past Medical History:  Diagnosis Date   Anemia    Anxiety    Obesity    Vaginal Pap smear, abnormal    Past Surgical History:  Procedure Laterality Date   surgery on lip  1994   stabbed with screwdriver   Social History   Socioeconomic History   Marital status: Married    Spouse name: Not on file   Number of children: 4   Years of education: Not on file   Highest education level: Not on file  Occupational History   Not on file  Tobacco Use   Smoking status: Never   Smokeless tobacco: Never  Vaping Use   Vaping status: Never Used  Substance and Sexual Activity   Alcohol use: Yes    Comment: occasional   Drug use: No   Sexual activity: Not on file  Other Topics Concern   Not on file  Social History Narrative   Not on file   Social Drivers of Health   Financial Resource Strain: Low Risk  (06/08/2021)   Received from [facility], [facility]   Overall Financial Resource Strain (CARDIA)    Difficulty of Paying Living Expenses: Not hard at all  Food Insecurity: No Food Insecurity (06/08/2021)   Received from [facility], [facility]   Hunger Vital Sign    Worried About Running Out of Food in the Last Year: Never true    Ran Out of Food in the Last Year: Never true  Transportation Needs: No Transportation Needs (06/08/2021)   Received from [facility], [facility]   PRAPARE - Transportation    Lack of  Transportation (Medical): No    Lack of Transportation (Non-Medical): No  Physical Activity: Sufficiently Active (06/08/2021)   Received from [facility], [facility]   Exercise Vital Sign    Days of Exercise per Week: 6 days    Minutes of Exercise per Session: 150+ min  Stress: No Stress Concern Present (06/08/2021)   Received from [facility], [facility] - Occupational Stress Questionnaire    Feeling of Stress : Not at all  Social Connections: Unknown (08/11/2021)   Received from [facility], [facility]   Social Network    Social Network: Not on file  Recent Concern: Social Connections - Socially Isolated (06/08/2021)   Received from [facility], [facility]   Social Connection and Isolation Panel [NHANES]    Frequency of Communication with Friends and Family: More than three times a week    Frequency of Social Gatherings with Friends and Family: Once a week    Attends Religious Services: Never    Database administrator or Organizations: No    Attends Banker Meetings: Never    Marital Status: Divorced  Catering manager Violence: Unknown (07/06/2021)   Received from [employer], [facility]   HITS    Physically Hurt: Not on file  Insult or Talk Down To: Not on file    Threaten Physical Harm: Not on file    Scream or Curse: Not on file   No current facility-administered medications on file prior to encounter.   Current Outpatient Medications on File Prior to Encounter  Medication Sig Dispense Refill   naproxen (NAPROSYN) 500 MG tablet Take 1 tablet (500 mg total) by mouth 2 (two) times daily. 30 tablet 0   valACYclovir (VALTREX) 1000 MG tablet Take 1 tablet (1,000 mg total) by mouth 3 (three) times daily. 30 tablet 0   No Known Allergies  ROS:  Pertinent positives/negatives listed above.  I have reviewed patient's Past Medical Hx, Surgical Hx, Family Hx, Social Hx, medications and allergies.    Physical Exam  Patient Vitals for the past 24 hrs:  BP Temp Pulse Resp SpO2  06/12/23 0947 122/72 98.2 F (36.8 C) 100 12 99 %   Constitutional: Well-developed, well-nourished female in no acute distress.  Cardiovascular: normal rate Respiratory: normal effort GI: Abd soft, non-tender. Pos BS x 4 MS: Extremities nontender, no edema, normal ROM Neurologic: Alert and oriented x 4.  GU: Neg CVAT.  LAB RESULTS Results for orders placed or performed during the hospital encounter of 06/12/23 (from the past 24 hours)  Pregnancy, urine POC     Status: Abnormal   Collection Time: 06/12/23  9:54 AM  Result Value Ref Range   Preg Test, Ur POSITIVE (A) NEGATIVE  hCG, quantitative, pregnancy     Status: Abnormal   Collection Time: 06/12/23  9:59 AM  Result Value Ref Range   hCG, Beta Chain, Quant, S 28 (H) <5 mIU/mL       IMAGING US OB LESS THAN 14 WEEKS WITH OB TRANSVAGINAL Result Date: 06/12/2023 CLINICAL DATA:  [id number] Pregnancy, location unknown [id number] EXAM: OBSTETRIC <14 WK [place] AND TRANSVAGINAL OB US TECHNIQUE: Both transabdominal and transvaginal ultrasound examinations were performed for complete evaluation of the gestation as well as the maternal uterus, adnexal regions, and pelvic cul-de-sac. Transvaginal technique was performed to assess early pregnancy. COMPARISON:  None Available. FINDINGS: Intrauterine gestational sac: None LMP: 05/06/2023.  Gestational age by LMP would be 5 weeks 2 days. Subchorionic hemorrhage: None Right ovary: Unremarkable in appearance. It measures 2.7 x 2.3 x 1.9 cm for an estimated volume of 6.2 ML. Left ovary: LEFT ovary is estimated to span 4.7 x 1.8 x 2.8 cm for an estimated volume of 12 ML. In the LEFT adnexa there are 2 cystic structures. One is simple in appearance and measures 28 mm. It appears contiguous with the ovary. An additional cystic appearing area with a thicker adjacent hyperemic rind measures approximately 18 mm. It demonstrates several thin  internal septations. Provocation maneuvers were attempted but could not definitively demonstrate whether this area is distinct or contiguous with the ovary. Cine images suggests that this area is immediately adjacent to the ovary. Other :Unremarkable appearance of the endometrial stripe. Free fluid:  Small volume free fluid. IMPRESSION: 1. No intrauterine gestational sac is identified. 2. In the LEFT adnexa, there is an 18 mm complex cystic mass which is favored to be adjacent to the ovary although provocation maneuvers are inconclusive. This is favored to reflect an early ectopic pregnancy. Other differential considerations include a simple cyst within the LEFT ovary as well as a corpus luteal cyst. Differentiation is achieved with serial beta HCG supplemented by repeat sonography as clinically warranted. These results were called by telephone at the time of interpretation on 06/12/2023 at  12:28 pm to provider Wyn Forster , who verbally acknowledged these results. Electronically Signed   By: Meda Klinefelter M.D.   On: 06/12/2023 12:31    MAU Management/MDM: Orders Placed This Encounter  Procedures   US OB LESS THAN 14 WEEKS WITH OB TRANSVAGINAL   hCG, quantitative, pregnancy   Pregnancy, urine POC   Discharge patient Discharge disposition: 01-Home or Self Care; Discharge patient date: 06/12/2023    No orders of the defined types were placed in this encounter.   ASSESSMENT 1. Pregnancy of unknown anatomic location   2. [redacted] weeks gestation of pregnancy   3. First trimester bleeding   Pregnancy of unknown location with reportedly decreasing bHCG  Exam, results and imaging discussed with Dr. Debroah Loop  PLAN Discharge home with strict return and ectopic precautions. Follow up bHCG in 48 hours - patient already scheduled for follow up Monday with [facility] [place] in 10-14 days if appropriately increasing bHCG  Allergies as of 06/12/2023   No Known Allergies      Medication  List     STOP taking these medications    naproxen 500 MG tablet Commonly known as: NAPROSYN       TAKE these medications    valACYclovir 1000 MG tablet Commonly known as: VALTREX Take 1 tablet (1,000 mg total) by mouth 3 (three) times daily.        Follow-up Information     Ranae Pila, MD Follow up.   Specialty: Obstetrics and Gynecology Why: Follow up bHCG blood work in 48 hours or sooner for concerns Contact information: [address] [place] [address] [phone]                 Wyn Forster, MD FMOB Fellow, Faculty practice [facility]  06/12/2023  12:41 PM

## 2023-07-28 ENCOUNTER — Inpatient Hospital Stay (HOSPITAL_COMMUNITY)
Admission: AD | Admit: 2023-07-28 | Discharge: 2023-07-28 | Disposition: A | Discharge status: Home / Self Care | Attending: Obstetrics & Gynecology | Admitting: Obstetrics & Gynecology

## 2023-07-28 ENCOUNTER — Other Ambulatory Visit: Payer: Self-pay

## 2023-07-28 DIAGNOSIS — Z3A11 11 weeks gestation of pregnancy: Secondary | ICD-10-CM | POA: Diagnosis not present

## 2023-07-28 DIAGNOSIS — O009 Unspecified ectopic pregnancy without intrauterine pregnancy: Secondary | ICD-10-CM | POA: Insufficient documentation

## 2023-07-28 LAB — CBC WITH DIFFERENTIAL/PLATELET
Abs Immature Granulocytes: 0 10*3/uL (ref 0.00–0.07)
Basophils Absolute: 0 10*3/uL (ref 0.0–0.1)
Basophils Relative: 0 %
Eosinophils Absolute: 0.3 10*3/uL (ref 0.0–0.5)
Eosinophils Relative: 2 %
HCT: 35 % — ABNORMAL LOW (ref 36.0–46.0)
Hemoglobin: 11.9 g/dL — ABNORMAL LOW (ref 12.0–15.0)
Lymphocytes Relative: 38 %
Lymphs Abs: 4.9 10*3/uL — ABNORMAL HIGH (ref 0.7–4.0)
MCH: 27.4 pg (ref 26.0–34.0)
MCHC: 34 g/dL (ref 30.0–36.0)
MCV: 80.5 fL (ref 80.0–100.0)
Monocytes Absolute: 0.5 10*3/uL (ref 0.1–1.0)
Monocytes Relative: 4 %
Neutro Abs: 7.2 10*3/uL (ref 1.7–7.7)
Neutrophils Relative %: 56 %
Platelets: 344 10*3/uL (ref 150–400)
RBC: 4.35 MIL/uL (ref 3.87–5.11)
RDW: 13.5 % (ref 11.5–15.5)
WBC: 12.8 10*3/uL — ABNORMAL HIGH (ref 4.0–10.5)
nRBC: 0 % (ref 0.0–0.2)
nRBC: 0 /100{WBCs}

## 2023-07-28 LAB — COMPREHENSIVE METABOLIC PANEL WITH GFR
ALT: 15 U/L (ref 0–44)
AST: 16 U/L (ref 15–41)
Albumin: 3.1 g/dL — ABNORMAL LOW (ref 3.5–5.0)
Alkaline Phosphatase: 75 U/L (ref 38–126)
Anion gap: 9 (ref 5–15)
BUN: 15 mg/dL (ref 6–20)
CO2: 24 mmol/L (ref 22–32)
Calcium: 9.2 mg/dL (ref 8.9–10.3)
Chloride: 105 mmol/L (ref 98–111)
Creatinine, Ser: 0.74 mg/dL (ref 0.44–1.00)
GFR, Estimated: >60 mL/min (ref >60)
Glucose, Bld: 116 mg/dL — ABNORMAL HIGH (ref 70–99)
Potassium: 3.3 mmol/L — ABNORMAL LOW (ref 3.5–5.1)
Sodium: 138 mmol/L (ref 135–145)
Total Bilirubin: 0.4 mg/dL (ref 0.0–1.2)
Total Protein: 7 g/dL (ref 6.5–8.1)

## 2023-07-28 LAB — HCG, QUANTITATIVE, PREGNANCY: hCG, Beta Chain, Quant, S: 231 m[IU]/mL — ABNORMAL HIGH (ref <5)

## 2023-07-28 MED ORDER — METHOTREXATE FOR ECTOPIC PREGNANCY
50.0000 mg/m2 | Freq: Once | INTRAMUSCULAR | Status: AC
  Administered 2023-07-28: 107.5 mg via INTRAMUSCULAR
  Filled 2023-07-28: qty 4.3

## 2023-07-28 NOTE — MAU Provider Note (Signed)
 None     S [name] is a 38 y.o. [id number] patient who presents to MAU today with complaint of being sent from office for management of ectopic pregnancy with plan for methotrexate .   O BP (!) 142/80 (BP Location: Right Arm)   Pulse (!) 102   Temp 98.5 F (36.9 C) (Oral)   Resp 18   Ht 5' (1.524 m)   Wt 111.3 kg   LMP 05/09/2023 (Exact Date)   SpO2 100%   BMI 47.91 kg/m  Physical Exam Constitutional:      General: She is not in acute distress.    Appearance: Normal appearance. She is normal weight. She is not ill-appearing, toxic-appearing or diaphoretic.  Cardiovascular:     Rate and Rhythm: Normal rate.  Pulmonary:     Effort: Pulmonary effort is normal.  Neurological:     General: No focal deficit present.     Mental Status: She is alert.  Psychiatric:        Mood and Affect: Mood normal.        Behavior: Behavior normal.        Thought Content: Thought content normal.        Judgment: Judgment normal.     A Medical screening exam complete Ectopic pregnancy, unspecified location, unspecified whether intrauterine pregnancy present   P Care to be managed by Dr. Dyanna Glasgow Warning signs for worsening condition that would warrant emergency follow-up discussed Patient may return to MAU as needed   Salomon Cree, CNM 07/28/2023 8:03 PM

## 2023-07-28 NOTE — MAU Note (Addendum)
 PT given ECtopic/Methotrexate  handout and explained lab followup Day #4MAU and Day #7 in office. [facility] pharmacy called to let them know to prepare the medication

## 2023-07-28 NOTE — Progress Notes (Signed)
 Written and verbal d/c instructions given and pt voiced understanding. Will return Sunday afternoon for repeat BHCG or sooner for any concerns. Questions answered regarding Methotrexate  info sheet. Will have day #7 f/u in office and office will call her with an appt

## 2023-07-28 NOTE — Progress Notes (Signed)
 Dr. Agustin Aldo notified regarding pt arrival to MAU.  New orders requested, Dr. Cipriano Creeks stated she will enter orders.

## 2023-07-28 NOTE — MAU Note (Addendum)
 Amy Delgado is a 38 y.o. at [redacted]w[redacted]d here in MAU reporting: sent from [facility] office for Methotrexate  injection.  Denies VB and pain.  LMP: 05/05/2023 Onset of complaint: today Pain score: 0 Vitals:   07/28/23 1858  BP: (!) 142/80  Pulse: (!) 102  Resp: 18  Temp: 98.5 F (36.9 C)  SpO2: 100%     FHT: NA  Lab orders placed from triage: None

## 2023-07-31 ENCOUNTER — Inpatient Hospital Stay (HOSPITAL_COMMUNITY)
Admission: AD | Admit: 2023-07-31 | Discharge: 2023-07-31 | Disposition: A | Discharge status: Home / Self Care | Attending: Obstetrics and Gynecology | Admitting: Obstetrics and Gynecology

## 2023-07-31 ENCOUNTER — Other Ambulatory Visit: Payer: Self-pay

## 2023-07-31 DIAGNOSIS — Z3A01 Less than 8 weeks gestation of pregnancy: Secondary | ICD-10-CM | POA: Insufficient documentation

## 2023-07-31 DIAGNOSIS — O009 Unspecified ectopic pregnancy without intrauterine pregnancy: Secondary | ICD-10-CM | POA: Diagnosis present

## 2023-07-31 LAB — HCG, QUANTITATIVE, PREGNANCY: hCG, Beta Chain, Quant, S: 23 m[IU]/mL — ABNORMAL HIGH (ref <5)

## 2023-07-31 NOTE — MAU Note (Signed)
 Patient was seen in MAU Triage on 4/24 for ectopic pregnancy. Was given methotrexate  and is here for a re-check on levels. Patient also mentioned bleeding as if she was on her period (she is not bleeding through a pad in less than a hour) and passing small nickel sized clots.

## 2023-07-31 NOTE — Discharge Instructions (Signed)
 Return to MAU: If you have heavier bleeding that soaks through more that 2 pads per hour for an hour or more If you bleed so much that you feel like you might pass out or you do pass out If you have significant abdominal pain that is not improved with Tylenol 1000 mg every 8 hours as needed for pain If you develop a fever > 100.5

## 2023-07-31 NOTE — MAU Provider Note (Signed)
 History   Chief Complaint:  Vaginal Bleeding   Amy Delgado is  38 y.o. [id number] Patient's last menstrual period was 05/09/2023 (exact date).. Patient is here for follow up of quantitative HCG and ongoing surveillance of pregnancy status. She is [redacted]w[redacted]d weeks gestation  by LMP.    Since her last visit, the patient is without new complaint. The patient reports bleeding as  bright red, similar to period, and passing small clots.  She denies any pain.  General ROS:  positive for vaginal bleeding with small clots  Her previous Quantitative HCG values are:  Recent Labs  Lab 07/28/23 1951  [id number] 231*   Physical Exam   Blood pressure 109/66, pulse 95, temperature 98.2 F (36.8 C), temperature source Oral, resp. rate 20, last menstrual period 05/09/2023, SpO2 100%, unknown if currently breastfeeding.  Focused Gynecological Exam: examination not indicated  Labs: Results for orders placed or performed during the hospital encounter of 07/31/23 (from the past 24 hours)  hCG, quantitative, pregnancy   Collection Time: 07/31/23  9:13 PM  Result Value Ref Range   hCG, Beta Chain, Quant, S 23 (H) <5 mIU/mL    Ultrasound Studies:   No results found.  Assessment:   1. Ectopic pregnancy without intrauterine pregnancy, unspecified location   2. [redacted] weeks gestation of pregnancy     Plan: -Discharge home in stable condition -Vaginal bleeding precautions discussed -Patient advised to follow-up with [facility] (Dr. Cipriano Creeks) as scheduled on Thursday 08/04/2023 -Patient may return to MAU as needed or if her condition were to change or worsen  Almond Army, CNM 07/31/2023, 10:06 PM

## 2023-10-01 ENCOUNTER — Emergency Department (HOSPITAL_BASED_OUTPATIENT_CLINIC_OR_DEPARTMENT_OTHER): Admitting: Radiology

## 2023-10-01 ENCOUNTER — Other Ambulatory Visit: Payer: Self-pay

## 2023-10-01 ENCOUNTER — Encounter (HOSPITAL_BASED_OUTPATIENT_CLINIC_OR_DEPARTMENT_OTHER): Payer: Self-pay

## 2023-10-01 DIAGNOSIS — J40 Bronchitis, not specified as acute or chronic: Secondary | ICD-10-CM | POA: Diagnosis not present

## 2023-10-01 DIAGNOSIS — R0602 Shortness of breath: Secondary | ICD-10-CM | POA: Diagnosis present

## 2023-10-01 LAB — CBC
HCT: 35.6 % — ABNORMAL LOW (ref 36.0–46.0)
Hemoglobin: 11.9 g/dL — ABNORMAL LOW (ref 12.0–15.0)
MCH: 27.3 pg (ref 26.0–34.0)
MCHC: 33.4 g/dL (ref 30.0–36.0)
MCV: 81.7 fL (ref 80.0–100.0)
Platelets: 317 10*3/uL (ref 150–400)
RBC: 4.36 MIL/uL (ref 3.87–5.11)
RDW: 14.2 % (ref 11.5–15.5)
WBC: 12.6 10*3/uL — ABNORMAL HIGH (ref 4.0–10.5)
nRBC: 0 % (ref 0.0–0.2)

## 2023-10-01 LAB — BASIC METABOLIC PANEL WITH GFR
Anion gap: 10 (ref 5–15)
BUN: 9 mg/dL (ref 6–20)
CO2: 25 mmol/L (ref 22–32)
Calcium: 9.5 mg/dL (ref 8.9–10.3)
Chloride: 107 mmol/L (ref 98–111)
Creatinine, Ser: 0.79 mg/dL (ref 0.44–1.00)
GFR, Estimated: >60 mL/min (ref >60)
Glucose, Bld: 133 mg/dL — ABNORMAL HIGH (ref 70–99)
Potassium: 3.6 mmol/L (ref 3.5–5.1)
Sodium: 142 mmol/L (ref 135–145)

## 2023-10-01 LAB — RESP PANEL BY RT-PCR (RSV, FLU A&B, COVID)  RVPGX2
Influenza A by PCR: NEGATIVE
Influenza B by PCR: NEGATIVE
Resp Syncytial Virus by PCR: NEGATIVE
SARS Coronavirus 2 by RT PCR: NEGATIVE

## 2023-10-01 NOTE — ED Triage Notes (Signed)
 Pt reports catching a cold 3 days ago and been having some shortness of breath since then. Pt reports using albuterol inhaler at home. Lungs clear and diminished per RT. Pt mildly labored with breathing. Pt denies CP, N/V/D, fever.

## 2023-10-02 ENCOUNTER — Emergency Department (HOSPITAL_BASED_OUTPATIENT_CLINIC_OR_DEPARTMENT_OTHER)
Admission: EM | Admit: 2023-10-02 | Discharge: 2023-10-02 | Disposition: A | Discharge status: Home / Self Care | Attending: Emergency Medicine | Admitting: Emergency Medicine

## 2023-10-02 DIAGNOSIS — J209 Acute bronchitis, unspecified: Secondary | ICD-10-CM

## 2023-10-02 MED ORDER — AZITHROMYCIN 250 MG PO TABS: 250.0000 mg | ORAL_TABLET | Freq: Every day | ORAL | 0 refills | Status: AC

## 2023-10-02 MED ORDER — PREDNISONE 10 MG PO TABS: 20.0000 mg | ORAL_TABLET | Freq: Two times a day (BID) | ORAL | 0 refills | Status: AC

## 2023-10-02 MED ORDER — AZITHROMYCIN 250 MG PO TABS
500.0000 mg | ORAL_TABLET | Freq: Once | ORAL | Status: AC
  Administered 2023-10-02: 500 mg via ORAL
  Filled 2023-10-02: qty 2

## 2023-10-02 MED ORDER — DEXAMETHASONE SODIUM PHOSPHATE 10 MG/ML IJ SOLN
10.0000 mg | Freq: Once | INTRAMUSCULAR | Status: AC
  Administered 2023-10-02: 10 mg via INTRAMUSCULAR
  Filled 2023-10-02: qty 1

## 2023-10-02 NOTE — ED Provider Notes (Signed)
 [place] EMERGENCY DEPARTMENT AT [facility] Provider Note   CSN: [id number] Arrival date & time: 10/01/23  2217     Patient presents with: Shortness of Breath   Amy Delgado is a 38 y.o. female.   Patient is a 38 year old female with history of asthma.  Patient presenting today with complaints of congestion and cough that has been present for the past several days.  She has been using her inhaler and nebulizer at home with little relief.  No fevers or chills.  Cough is intermittently productive.  No ill contacts.       Prior to Admission medications   Medication Sig Start Date End Date Taking? Authorizing Provider  valACYclovir  (VALTREX ) 1000 MG tablet Take 1 tablet (1,000 mg total) by mouth 3 (three) times daily. 04/14/22   Christopher Savannah, PA-C    Allergies: Patient has no known allergies.    Review of Systems  All other systems reviewed and are negative.   Updated Vital Signs BP (!) 153/96   Pulse (!) 118   Temp 98.6 F (37 C) (Oral)   Resp (!) 22   Ht 5' (1.524 m)   Wt 99.8 kg   LMP 05/09/2023 (Exact Date)   SpO2 100%   BMI 42.97 kg/m   Physical Exam Vitals and nursing note reviewed.  Constitutional:      General: She is not in acute distress.    Appearance: She is well-developed. She is not diaphoretic.  HENT:     Head: Normocephalic and atraumatic.   Cardiovascular:     Rate and Rhythm: Normal rate and regular rhythm.     Heart sounds: No murmur heard.    No friction rub. No gallop.  Pulmonary:     Effort: Pulmonary effort is normal. No respiratory distress.     Breath sounds: Normal breath sounds. No wheezing.  Abdominal:     General: Bowel sounds are normal. There is no distension.     Palpations: Abdomen is soft.     Tenderness: There is no abdominal tenderness.   Musculoskeletal:        General: Normal range of motion.     Cervical back: Normal range of motion and neck supple.   Skin:    General: Skin is warm and dry.    Neurological:     General: No focal deficit present.     Mental Status: She is alert and oriented to person, place, and time.     (all labs ordered are listed, but only abnormal results are displayed) Labs Reviewed  BASIC METABOLIC PANEL WITH GFR - Abnormal; Notable for the following components:      Result Value   Glucose, Bld 133 (*)    All other components within normal limits  CBC - Abnormal; Notable for the following components:   WBC 12.6 (*)    Hemoglobin 11.9 (*)    HCT 35.6 (*)    All other components within normal limits  RESP PANEL BY RT-PCR (RSV, FLU A&B, COVID)  RVPGX2  PREGNANCY, URINE    EKG: None  Radiology: DG Chest 2 View Result Date: 10/01/2023 CLINICAL DATA:  Shortness of breath. EXAM: CHEST - 2 VIEW COMPARISON:  Remote radiograph 07/05/2006 FINDINGS: The cardiomediastinal contours are normal. Bronchial thickening. Pulmonary vasculature is normal. No consolidation, pleural effusion, or pneumothorax. No acute osseous abnormalities are seen. IMPRESSION: Bronchial thickening. Electronically Signed   By: Andrea Gasman M.D.   On: 10/01/2023 23:49     Procedures  Medications Ordered in the ED  dexamethasone  (DECADRON ) injection 10 mg (has no administration in time range)  azithromycin (ZITHROMAX) tablet 500 mg (has no administration in time range)                                    Medical Decision Making Amount and/or Complexity of Data Reviewed Labs: ordered. Radiology: ordered.  Risk Prescription drug management.   Patient presenting with URI symptoms as described in the HPI.  Patient arrives with stable vital signs and is afebrile.  Oxygen saturations are 100% on room air and physical examination is otherwise unremarkable.  Laboratory studies obtained including CBC and basic metabolic panel, both of which are unremarkable.  COVID/flu/RSV all negative.  Chest x-ray showing bronchitis.  Patient to be given an IM shot of Decadron  and  discharged with prednisone and Zithromax.     Final diagnoses:  None    ED Discharge Orders     None          Geroldine Berg, MD 10/02/23 [phone]

## 2023-10-02 NOTE — Discharge Instructions (Signed)
 Begin taking Zithromax and prednisone as prescribed.  Continue use of your inhaler/nebulizer as needed.  Follow-up with primary doctor if not improving.

## 2024-08-27 ENCOUNTER — Inpatient Hospital Stay (HOSPITAL_COMMUNITY): Admission: RE | Admit: 2024-08-27 | Payer: Self-pay | Source: Home / Self Care | Admitting: Obstetrics & Gynecology
